# Patient Record
Sex: Male | Born: 1975
Health system: Southern US, Community
[De-identification: ages and names within clinical notes are randomized; demographics above are authoritative.]

## PROBLEM LIST (undated history)

## (undated) DIAGNOSIS — I10 Essential (primary) hypertension: Secondary | ICD-10-CM

## (undated) DIAGNOSIS — G4730 Sleep apnea, unspecified: Secondary | ICD-10-CM

## (undated) DIAGNOSIS — T7840XA Allergy, unspecified, initial encounter: Secondary | ICD-10-CM

## (undated) HISTORY — DX: Allergy, unspecified, initial encounter: T78.40XA

## (undated) HISTORY — PX: LUMBAR DISC SURGERY: SHX700

## (undated) HISTORY — PX: FOOT SURGERY: SHX648

## (undated) HISTORY — DX: Essential (primary) hypertension: I10

---

## 2003-07-25 ENCOUNTER — Encounter: Payer: Self-pay | Admitting: Emergency Medicine

## 2003-07-25 ENCOUNTER — Emergency Department (HOSPITAL_COMMUNITY): Admission: EM | Admit: 2003-07-25 | Discharge: 2003-07-25 | Payer: Self-pay | Admitting: Emergency Medicine

## 2009-04-14 ENCOUNTER — Emergency Department (HOSPITAL_COMMUNITY): Admission: EM | Admit: 2009-04-14 | Discharge: 2009-04-14 | Payer: Self-pay | Admitting: Emergency Medicine

## 2011-02-12 LAB — CBC
HCT: 40.3 % (ref 39.0–52.0)
Hemoglobin: 13.4 g/dL (ref 13.0–17.0)
MCHC: 33.3 g/dL (ref 30.0–36.0)
RBC: 4.33 MIL/uL (ref 4.22–5.81)

## 2011-02-12 LAB — DIFFERENTIAL
Basophils Relative: 1 % (ref 0–1)
Eosinophils Absolute: 0.2 10*3/uL (ref 0.0–0.7)
Eosinophils Relative: 3 % (ref 0–5)
Lymphs Abs: 2.1 10*3/uL (ref 0.7–4.0)
Monocytes Relative: 11 % (ref 3–12)

## 2011-02-12 LAB — POCT I-STAT, CHEM 8
Creatinine, Ser: 1.2 mg/dL (ref 0.4–1.5)
Glucose, Bld: 89 mg/dL (ref 70–99)
HCT: 41 % (ref 39.0–52.0)
Hemoglobin: 13.9 g/dL (ref 13.0–17.0)
Sodium: 141 mEq/L (ref 135–145)
TCO2: 26 mmol/L (ref 0–100)

## 2011-02-12 LAB — URINE MICROSCOPIC-ADD ON

## 2011-02-12 LAB — URINALYSIS, ROUTINE W REFLEX MICROSCOPIC
Protein, ur: NEGATIVE mg/dL
Urobilinogen, UA: 0.2 mg/dL (ref 0.0–1.0)
pH: 6 (ref 5.0–8.0)

## 2013-10-05 ENCOUNTER — Other Ambulatory Visit: Payer: Self-pay | Admitting: Neurosurgery

## 2013-10-05 DIAGNOSIS — M5416 Radiculopathy, lumbar region: Secondary | ICD-10-CM

## 2013-10-12 ENCOUNTER — Other Ambulatory Visit: Payer: Self-pay

## 2015-08-02 DIAGNOSIS — K219 Gastro-esophageal reflux disease without esophagitis: Secondary | ICD-10-CM | POA: Insufficient documentation

## 2015-08-08 DIAGNOSIS — E559 Vitamin D deficiency, unspecified: Secondary | ICD-10-CM | POA: Insufficient documentation

## 2015-08-09 ENCOUNTER — Other Ambulatory Visit: Payer: Self-pay | Admitting: Family Medicine

## 2015-08-09 DIAGNOSIS — R945 Abnormal results of liver function studies: Principal | ICD-10-CM

## 2015-08-09 DIAGNOSIS — R7989 Other specified abnormal findings of blood chemistry: Secondary | ICD-10-CM

## 2015-08-15 ENCOUNTER — Other Ambulatory Visit: Payer: Self-pay

## 2015-08-23 ENCOUNTER — Encounter: Payer: Self-pay | Admitting: Neurology

## 2015-08-23 ENCOUNTER — Ambulatory Visit (INDEPENDENT_AMBULATORY_CARE_PROVIDER_SITE_OTHER): Payer: BLUE CROSS/BLUE SHIELD | Admitting: Neurology

## 2015-08-23 VITALS — BP 108/78 | HR 78 | Resp 18 | Ht 72.0 in | Wt 214.0 lb

## 2015-08-23 DIAGNOSIS — E663 Overweight: Secondary | ICD-10-CM

## 2015-08-23 DIAGNOSIS — G4719 Other hypersomnia: Secondary | ICD-10-CM

## 2015-08-23 DIAGNOSIS — G4733 Obstructive sleep apnea (adult) (pediatric): Secondary | ICD-10-CM | POA: Diagnosis not present

## 2015-08-23 DIAGNOSIS — R351 Nocturia: Secondary | ICD-10-CM | POA: Diagnosis not present

## 2015-08-23 DIAGNOSIS — G4761 Periodic limb movement disorder: Secondary | ICD-10-CM

## 2015-08-23 NOTE — Patient Instructions (Signed)
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
## 2015-09-07 ENCOUNTER — Ambulatory Visit (INDEPENDENT_AMBULATORY_CARE_PROVIDER_SITE_OTHER): Payer: BLUE CROSS/BLUE SHIELD | Admitting: Neurology

## 2015-09-07 DIAGNOSIS — G4733 Obstructive sleep apnea (adult) (pediatric): Secondary | ICD-10-CM | POA: Diagnosis not present

## 2015-09-07 DIAGNOSIS — G4720 Circadian rhythm sleep disorder, unspecified type: Secondary | ICD-10-CM

## 2015-09-07 DIAGNOSIS — G479 Sleep disorder, unspecified: Secondary | ICD-10-CM

## 2015-09-08 NOTE — Sleep Study (Signed)
Please see the scanned sleep study interpretation located in the procedure tab within the chart review section.   

## 2015-09-13 ENCOUNTER — Telehealth: Payer: Self-pay | Admitting: Neurology

## 2015-09-13 DIAGNOSIS — G4733 Obstructive sleep apnea (adult) (pediatric): Secondary | ICD-10-CM

## 2015-09-13 NOTE — Telephone Encounter (Signed)
Patient referred by Dr. Duanne Guessewey, seen by me on 08/23/15, diagnostic PSG on 09/07/15, ins: [employer].   Please call and notify the patient that the recent sleep study did confirm the diagnosis of obstructive sleep apnea and that I recommend treatment for this in the form of CPAP. This will require a repeat sleep study for proper titration and mask fitting. Please explain to patient and arrange for a CPAP titration study. I have placed an order in the chart. Thanks, and [facility] next sleep [facility].  Huston FoleySaima Carolena Fairbank, MD, PhD [facility])

## 2015-09-14 NOTE — Telephone Encounter (Signed)
Left message for patient to call back for sleep study results.  

## 2015-09-19 NOTE — Telephone Encounter (Signed)
I spoke to patient, he would like to proceed with titration study. He also wanted an appt with Dr. Frances FurbishAthar to talk about other treatment options. He has f/u this week.

## 2015-09-21 ENCOUNTER — Telehealth: Payer: Self-pay

## 2015-09-21 ENCOUNTER — Ambulatory Visit: Payer: Self-pay | Admitting: Neurology

## 2015-09-21 NOTE — Telephone Encounter (Signed)
Patient did not come to appt today.  

## 2015-09-22 ENCOUNTER — Encounter: Payer: Self-pay | Admitting: Neurology

## 2015-10-04 ENCOUNTER — Ambulatory Visit (INDEPENDENT_AMBULATORY_CARE_PROVIDER_SITE_OTHER): Payer: BLUE CROSS/BLUE SHIELD | Admitting: Neurology

## 2015-10-04 DIAGNOSIS — G4733 Obstructive sleep apnea (adult) (pediatric): Secondary | ICD-10-CM | POA: Diagnosis not present

## 2015-10-05 ENCOUNTER — Telehealth: Payer: Self-pay | Admitting: Neurology

## 2015-10-05 NOTE — Telephone Encounter (Signed)
Left message on vm stating that we were unable to get him correctly set up on CPAP due to him not getting any sleep with CPAP. I asked him to call back to make f/u appt with Dr. Frances FurbishAthar and discuss other options. Left call back number.  I will fax report to PCP.
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
## 2016-06-18 ENCOUNTER — Ambulatory Visit: Payer: BLUE CROSS/BLUE SHIELD | Admitting: Podiatry

## 2016-08-06 ENCOUNTER — Ambulatory Visit (INDEPENDENT_AMBULATORY_CARE_PROVIDER_SITE_OTHER): Payer: BLUE CROSS/BLUE SHIELD | Admitting: Podiatry

## 2016-08-06 ENCOUNTER — Encounter: Payer: Self-pay | Admitting: Podiatry

## 2016-08-06 ENCOUNTER — Ambulatory Visit (INDEPENDENT_AMBULATORY_CARE_PROVIDER_SITE_OTHER): Payer: BLUE CROSS/BLUE SHIELD

## 2016-08-06 VITALS — BP 134/93 | HR 122 | Resp 16 | Ht 72.0 in | Wt 205.0 lb

## 2016-08-06 DIAGNOSIS — M79675 Pain in left toe(s): Secondary | ICD-10-CM | POA: Diagnosis not present

## 2016-08-06 DIAGNOSIS — M79674 Pain in right toe(s): Secondary | ICD-10-CM | POA: Diagnosis not present

## 2016-08-06 DIAGNOSIS — M779 Enthesopathy, unspecified: Secondary | ICD-10-CM

## 2016-08-06 DIAGNOSIS — M205X9 Other deformities of toe(s) (acquired), unspecified foot: Secondary | ICD-10-CM

## 2016-08-06 MED ORDER — TRIAMCINOLONE ACETONIDE 10 MG/ML IJ SUSP
10.0000 mg | Freq: Once | INTRAMUSCULAR | Status: AC
  Administered 2016-08-06: 10 mg

## 2016-08-06 NOTE — Progress Notes (Signed)
   Subjective:    Patient ID: Sean Torres, male    DOB: 08-20-1976, 40 y.o.   MRN: [id number]  HPI Chief Complaint  Patient presents with  . Toe Pain    Bilateral; great toes-joint; x6 months      Review of Systems  All other systems reviewed and are negative.      Objective:   Physical Exam        Assessment & Plan:

## 2016-08-06 NOTE — Patient Instructions (Signed)

## 2016-08-07 NOTE — Progress Notes (Signed)
Subjective:     Patient ID: Sean Torres, male   DOB: 26-Mar-1976, 40 y.o.   MRN: [id number]  HPI patient presents with painful big toe joint right over left and states it's increasingly hard for him to be active or do sports. He's noted enlargement around the joint surface and reduced range of motion with increased pain with motion   Review of Systems  All other systems reviewed and are negative.      Objective:   Physical Exam  Constitutional: He is oriented to person, place, and time.  Cardiovascular: Intact distal pulses.   Musculoskeletal: Normal range of motion.  Neurological: He is oriented to person, place, and time.  Skin: Skin is warm.  Nursing note and vitals reviewed.  neurovascular status intact muscle strength adequate range of motion within normal limits with patient found to have inflamed red swollen and painful joint first MPJ right over left with spur formation and reduced range of motion with crepitus on the distal surface. It is localized to this area with no other pathology noted and patient is found to have good digital perfusion and is well oriented 3     Assessment:     Hallux limitus rigidus condition right with reduced range of motion of the joint and joint space narrowing    Plan:     H&P condition reviewed and recommended due to the advancement of condition pain that we consider a osteotomy of the first metatarsal right and I reviewed procedure and risk associated with this. Patient would like to have this done and is can have done in the next 4 weeks and I educated patient on spur removal shortening plantar flexor-type osteotomy and the fact that ultimately it may require joint implantation or fusion procedure. Reappoint in several weeks for consult and scheduled for surgery  X-ray report indicates that there is elevation of the first metatarsal right and left with narrowing of the joint surface right over the left and dorsal spurring around the first  metatarsal

## 2016-09-17 ENCOUNTER — Telehealth: Payer: Self-pay | Admitting: *Deleted

## 2016-09-17 NOTE — Telephone Encounter (Signed)
"  I am supposed to have surgery on tomorrow with Norm and I haven't heard anything about what time I need to be there.  I am hoping you can help me with this."  Someone from the surgical center normally calls with the arrival time either the Friday or Monday before.  You can call the surgical center directly to get the time if you like.  "Okay, thank you."

## 2016-09-18 ENCOUNTER — Encounter: Payer: Self-pay | Admitting: Podiatry

## 2016-09-18 DIAGNOSIS — M21611 Bunion of right foot: Secondary | ICD-10-CM | POA: Diagnosis not present

## 2016-09-18 DIAGNOSIS — M722 Plantar fascial fibromatosis: Secondary | ICD-10-CM | POA: Diagnosis not present

## 2016-09-18 DIAGNOSIS — M7752 Other enthesopathy of left foot: Secondary | ICD-10-CM | POA: Diagnosis not present

## 2016-09-18 DIAGNOSIS — M2021 Hallux rigidus, right foot: Secondary | ICD-10-CM | POA: Diagnosis not present

## 2016-09-18 DIAGNOSIS — M2011 Hallux valgus (acquired), right foot: Secondary | ICD-10-CM | POA: Diagnosis not present

## 2016-09-18 DIAGNOSIS — R0683 Snoring: Secondary | ICD-10-CM | POA: Diagnosis not present

## 2016-09-19 ENCOUNTER — Telehealth: Payer: Self-pay | Admitting: *Deleted

## 2016-09-19 MED ORDER — HYDROMORPHONE HCL 4 MG PO TABS: 4.0000 mg | ORAL_TABLET | ORAL | 0 refills | Status: DC | PRN

## 2016-09-19 NOTE — Telephone Encounter (Signed)
Medical director issued a decision that the request for this procedure was denied.  Based upon reading the notes information is not given regarding conservative care.  The doctor can appeal by doing a peer to peer at [phone].  Thank you so much.

## 2016-09-19 NOTE — Telephone Encounter (Addendum)
Pt states he is having a minor reaction to his pain medication. Pt states he has an overall rash and severe itching due to the Percocet. Pt states he has used Dilaudid for his back without problem and doesn't believe he has had a reaction to Hydrocodone. I told pt to stop the Percocet, take OTC Benadryl as prescribed, and if tolerates take OTC Ibuprofen 2 tablets 3-4 times a day and I would call again with change in pain medication. Dr. Charlsie Merlesegal ordered Dilaudid 4mg  #30 one tablet every 4 hours prn severe foot pain. I informed pt that [place].

## 2016-09-19 NOTE — Telephone Encounter (Signed)
We can write him for dilaudid

## 2016-09-21 NOTE — Progress Notes (Signed)
DOS 11.14.2017 Bi-Planar Osteotomy with Pin Fixation 1st Right; Injection Left 1st Met

## 2016-09-26 ENCOUNTER — Ambulatory Visit (INDEPENDENT_AMBULATORY_CARE_PROVIDER_SITE_OTHER): Payer: BLUE CROSS/BLUE SHIELD | Admitting: Podiatry

## 2016-09-26 ENCOUNTER — Ambulatory Visit (INDEPENDENT_AMBULATORY_CARE_PROVIDER_SITE_OTHER): Payer: BLUE CROSS/BLUE SHIELD

## 2016-09-26 VITALS — Temp 98.0°F

## 2016-09-26 DIAGNOSIS — M779 Enthesopathy, unspecified: Secondary | ICD-10-CM | POA: Diagnosis not present

## 2016-09-26 DIAGNOSIS — Z9889 Other specified postprocedural states: Secondary | ICD-10-CM

## 2016-09-26 DIAGNOSIS — M205X9 Other deformities of toe(s) (acquired), unspecified foot: Secondary | ICD-10-CM

## 2016-09-26 MED ORDER — HYDROMORPHONE HCL 4 MG PO TABS: 4.0000 mg | ORAL_TABLET | ORAL | 0 refills | Status: DC | PRN

## 2016-09-26 NOTE — Progress Notes (Signed)
Subjective:     Patient ID: Sean Torres, male   DOB: 09-07-1976, 40 y.o.   MRN: [id number]  HPI patient presents stating I'm doing pretty well with occasional swelling and I admit I did walk on my foot without the boot   Review of Systems     Objective:   Physical Exam Neurovascular status intact negative Homans sign was noted with patient found to have discomfort of the first MPJ right that healing well with wound edges well coapted good alignment and recently good motion for this. Postop. Mild excessive edema secondary to activity levels    Assessment:     Doing well post osteotomy first metatarsal right    Plan:     Instructed on elevation and instructed on lateral motion exercises dispensed a bracing therapy in order to plantarflexed the right hallux and explained how to use it and patient will use this for periods of time along with surgical shoe which she can start but we'll continue elevation and continue compression  X-ray indicates that the osteotomy is healing well with pins in place joint open and no indications of movement

## 2016-10-03 ENCOUNTER — Ambulatory Visit (INDEPENDENT_AMBULATORY_CARE_PROVIDER_SITE_OTHER): Payer: BLUE CROSS/BLUE SHIELD | Admitting: Podiatry

## 2016-10-03 ENCOUNTER — Ambulatory Visit (INDEPENDENT_AMBULATORY_CARE_PROVIDER_SITE_OTHER): Payer: BLUE CROSS/BLUE SHIELD

## 2016-10-03 DIAGNOSIS — M779 Enthesopathy, unspecified: Secondary | ICD-10-CM

## 2016-10-03 DIAGNOSIS — M205X9 Other deformities of toe(s) (acquired), unspecified foot: Secondary | ICD-10-CM | POA: Diagnosis not present

## 2016-10-03 DIAGNOSIS — Z9889 Other specified postprocedural states: Secondary | ICD-10-CM

## 2016-10-04 NOTE — Progress Notes (Signed)
Subjective:     Patient ID: Ardelle LeschesJerry Leppo, male   DOB: 1976/07/27, 40 y.o.   MRN: [id number]  HPI patient states I'm doing well with my right foot with significant diminishment of discomfort and able to walk without significant swelling   Review of Systems     Objective:   Physical Exam Neurovascular status intact negative Homans sign was noted with patient's right first MPJ healing well with wound edges well coapted pins in place and no erythema or other pathology noted with mild edema normal for this. Postop    Assessment:     Doing well post biplanar osteotomy first metatarsal right with good range of motion and no crepitus    Plan:     H&P x-rays reviewed and discussed importance of continued range of motion exercises and gradual increase in activity levels. Continue with immobilization elevation compression and will be seen back again in 4 weeks or earlier if needed  X-ray report indicates the pins are in place the joint is congruence and open with no pathology noted

## 2016-10-12 ENCOUNTER — Encounter: Payer: Self-pay | Admitting: Podiatry

## 2016-11-14 ENCOUNTER — Encounter: Payer: BLUE CROSS/BLUE SHIELD | Admitting: Podiatry

## 2016-11-14 NOTE — Progress Notes (Signed)
This encounter was created in error - please disregard.  This encounter was created in error - please disregard.

## 2016-11-28 DIAGNOSIS — Z23 Encounter for immunization: Secondary | ICD-10-CM | POA: Diagnosis not present

## 2016-11-28 DIAGNOSIS — T23262A Burn of second degree of back of left hand, initial encounter: Secondary | ICD-10-CM | POA: Diagnosis not present

## 2017-03-19 DIAGNOSIS — L720 Epidermal cyst: Secondary | ICD-10-CM | POA: Diagnosis not present

## 2017-03-19 DIAGNOSIS — L218 Other seborrheic dermatitis: Secondary | ICD-10-CM | POA: Diagnosis not present

## 2017-03-28 ENCOUNTER — Ambulatory Visit: Payer: BLUE CROSS/BLUE SHIELD | Admitting: Podiatry

## 2017-03-29 ENCOUNTER — Ambulatory Visit (INDEPENDENT_AMBULATORY_CARE_PROVIDER_SITE_OTHER): Payer: BLUE CROSS/BLUE SHIELD

## 2017-03-29 ENCOUNTER — Ambulatory Visit (INDEPENDENT_AMBULATORY_CARE_PROVIDER_SITE_OTHER): Payer: BLUE CROSS/BLUE SHIELD | Admitting: Podiatry

## 2017-03-29 ENCOUNTER — Encounter: Payer: Self-pay | Admitting: Podiatry

## 2017-03-29 DIAGNOSIS — M779 Enthesopathy, unspecified: Secondary | ICD-10-CM | POA: Diagnosis not present

## 2017-03-29 DIAGNOSIS — M2011 Hallux valgus (acquired), right foot: Secondary | ICD-10-CM

## 2017-03-29 DIAGNOSIS — M205X9 Other deformities of toe(s) (acquired), unspecified foot: Secondary | ICD-10-CM | POA: Diagnosis not present

## 2017-03-29 MED ORDER — TRIAMCINOLONE ACETONIDE 10 MG/ML IJ SUSP
10.0000 mg | Freq: Once | INTRAMUSCULAR | Status: AC
  Administered 2017-03-29: 10 mg
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
## 2017-04-08 ENCOUNTER — Ambulatory Visit: Payer: BLUE CROSS/BLUE SHIELD | Admitting: Podiatry

## 2017-04-09 ENCOUNTER — Ambulatory Visit: Payer: BLUE CROSS/BLUE SHIELD | Admitting: Orthotics

## 2017-04-11 ENCOUNTER — Ambulatory Visit: Payer: BLUE CROSS/BLUE SHIELD | Admitting: Orthotics

## 2017-06-03 DIAGNOSIS — Y92410 Unspecified street and highway as the place of occurrence of the external cause: Secondary | ICD-10-CM | POA: Diagnosis not present

## 2017-06-03 DIAGNOSIS — S2341XA Sprain of ribs, initial encounter: Secondary | ICD-10-CM | POA: Diagnosis not present

## 2017-06-03 DIAGNOSIS — S42115A Nondisplaced fracture of body of scapula, left shoulder, initial encounter for closed fracture: Secondary | ICD-10-CM | POA: Diagnosis not present

## 2017-06-03 DIAGNOSIS — M25512 Pain in left shoulder: Secondary | ICD-10-CM | POA: Diagnosis not present

## 2017-06-03 DIAGNOSIS — S4992XA Unspecified injury of left shoulder and upper arm, initial encounter: Secondary | ICD-10-CM | POA: Diagnosis not present

## 2017-06-03 DIAGNOSIS — W051XXA Fall from non-moving nonmotorized scooter, initial encounter: Secondary | ICD-10-CM | POA: Diagnosis not present

## 2017-06-03 DIAGNOSIS — Y93I9 Activity, other involving external motion: Secondary | ICD-10-CM | POA: Diagnosis not present

## 2017-06-03 DIAGNOSIS — S42002A Fracture of unspecified part of left clavicle, initial encounter for closed fracture: Secondary | ICD-10-CM | POA: Diagnosis not present

## 2017-06-03 DIAGNOSIS — R109 Unspecified abdominal pain: Secondary | ICD-10-CM | POA: Diagnosis not present

## 2017-06-03 DIAGNOSIS — S298XXA Other specified injuries of thorax, initial encounter: Secondary | ICD-10-CM | POA: Diagnosis not present

## 2017-06-06 ENCOUNTER — Other Ambulatory Visit: Payer: Self-pay | Admitting: Orthopedic Surgery

## 2017-06-06 DIAGNOSIS — S20212A Contusion of left front wall of thorax, initial encounter: Secondary | ICD-10-CM | POA: Diagnosis not present

## 2017-06-06 DIAGNOSIS — S46012A Strain of muscle(s) and tendon(s) of the rotator cuff of left shoulder, initial encounter: Secondary | ICD-10-CM | POA: Diagnosis not present

## 2017-06-06 DIAGNOSIS — R519 Headache, unspecified: Secondary | ICD-10-CM

## 2017-06-06 DIAGNOSIS — R51 Headache: Principal | ICD-10-CM

## 2017-06-06 DIAGNOSIS — S060X0A Concussion without loss of consciousness, initial encounter: Secondary | ICD-10-CM | POA: Diagnosis not present

## 2017-06-07 ENCOUNTER — Ambulatory Visit
Admission: RE | Admit: 2017-06-07 | Discharge: 2017-06-07 | Disposition: A | Discharge status: Home / Self Care | Payer: BLUE CROSS/BLUE SHIELD | Source: Ambulatory Visit | Attending: Orthopedic Surgery | Admitting: Orthopedic Surgery

## 2017-06-07 DIAGNOSIS — R51 Headache: Principal | ICD-10-CM

## 2017-06-07 DIAGNOSIS — S0990XA Unspecified injury of head, initial encounter: Secondary | ICD-10-CM | POA: Diagnosis not present

## 2017-06-07 DIAGNOSIS — R220 Localized swelling, mass and lump, head: Secondary | ICD-10-CM | POA: Diagnosis not present

## 2017-06-07 DIAGNOSIS — R519 Headache, unspecified: Secondary | ICD-10-CM

## 2017-06-08 DIAGNOSIS — M25512 Pain in left shoulder: Secondary | ICD-10-CM | POA: Diagnosis not present

## 2017-06-10 DIAGNOSIS — M25512 Pain in left shoulder: Secondary | ICD-10-CM | POA: Diagnosis not present

## 2017-06-13 ENCOUNTER — Telehealth: Payer: Self-pay
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
## 2017-06-14 ENCOUNTER — Ambulatory Visit (INDEPENDENT_AMBULATORY_CARE_PROVIDER_SITE_OTHER): Payer: BLUE CROSS/BLUE SHIELD | Admitting: Family Medicine

## 2017-06-14 DIAGNOSIS — S060X0A Concussion without loss of consciousness, initial encounter: Secondary | ICD-10-CM

## 2017-06-14 DIAGNOSIS — S060X9A Concussion with loss of consciousness of unspecified duration, initial encounter: Secondary | ICD-10-CM | POA: Insufficient documentation

## 2017-06-14 DIAGNOSIS — S060XAA Concussion with loss of consciousness status unknown, initial encounter: Secondary | ICD-10-CM | POA: Insufficient documentation

## 2017-06-14 NOTE — Patient Instructions (Signed)
Good to see you I do think you have a concussion.  I would like you to limit screen time (including your phone) to 60 minutes daily outside of homework.  Week off form school In addition to this I recommend......  To help improve COGNITIVE function: Using fish oil/omega 3 that is 1000 mg (or roughly 600 mg EPA/DHA), starting as soon as possible after concussion, take: 3 tabs ONCE DAILY  for the next 10 days then 2 grams daily thereafter   To help reduce HEADACHES: Coenzyme Q10 160mg  ONCE DAILY Riboflavin/Vitamin B2 400mg  ONCE DAILY                                                                                            To help with INSOMNIA: Melatonin 3-5mg  AT BEDTIME    for the swelling of the side of the head arnica lotion 2 times daily can help a lot.   I want to see you again in 1 week (OK double book)
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
## 2017-06-21 ENCOUNTER — Ambulatory Visit: Payer: BLUE CROSS/BLUE SHIELD | Admitting: Family Medicine

## 2017-06-24 ENCOUNTER — Emergency Department (HOSPITAL_COMMUNITY)
Admission: EM | Admit: 2017-06-24 | Discharge: 2017-06-24 | Discharge status: Against Medical Advice | Payer: BLUE CROSS/BLUE SHIELD | Source: Home / Self Care

## 2017-06-24 ENCOUNTER — Encounter (HOSPITAL_COMMUNITY): Payer: Self-pay | Admitting: Emergency Medicine

## 2017-06-24 ENCOUNTER — Emergency Department (HOSPITAL_COMMUNITY)
Admission: EM | Admit: 2017-06-24 | Discharge: 2017-06-25 | Disposition: A | Discharge status: Home / Self Care | Payer: BLUE CROSS/BLUE SHIELD | Attending: Emergency Medicine | Admitting: Emergency Medicine

## 2017-06-24 ENCOUNTER — Emergency Department (HOSPITAL_COMMUNITY): Payer: BLUE CROSS/BLUE SHIELD

## 2017-06-24 DIAGNOSIS — S060X0A Concussion without loss of consciousness, initial encounter: Secondary | ICD-10-CM | POA: Insufficient documentation

## 2017-06-24 DIAGNOSIS — Z87891 Personal history of nicotine dependence: Secondary | ICD-10-CM | POA: Diagnosis not present

## 2017-06-24 DIAGNOSIS — Y9353 Activity, golf: Secondary | ICD-10-CM | POA: Insufficient documentation

## 2017-06-24 DIAGNOSIS — Y999 Unspecified external cause status: Secondary | ICD-10-CM | POA: Insufficient documentation

## 2017-06-24 DIAGNOSIS — Y9239 Other specified sports and athletic area as the place of occurrence of the external cause: Secondary | ICD-10-CM | POA: Insufficient documentation

## 2017-06-24 DIAGNOSIS — S0990XA Unspecified injury of head, initial encounter: Secondary | ICD-10-CM | POA: Diagnosis not present

## 2017-06-24 MED ORDER — PROCHLORPERAZINE EDISYLATE 5 MG/ML IJ SOLN
10.0000 mg | Freq: Once | INTRAMUSCULAR | Status: DC
  Filled 2017-06-24: qty 2

## 2017-06-24 MED ORDER — PROMETHAZINE HCL 25 MG PO TABS
25.0000 mg | ORAL_TABLET | Freq: Once | ORAL | Status: AC
  Administered 2017-06-24: 25 mg via ORAL
  Filled 2017-06-24: qty 1

## 2017-06-24 MED ORDER — ONDANSETRON 4 MG PO TBDP: 4.0000 mg | ORAL_TABLET | Freq: Three times a day (TID) | ORAL | 0 refills | Status: DC | PRN

## 2017-06-24 MED ORDER — DIPHENHYDRAMINE HCL 50 MG/ML IJ SOLN
25.0000 mg | Freq: Once | INTRAMUSCULAR | Status: DC
  Filled 2017-06-24: qty 1
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
## 2017-06-24 NOTE — Discharge Instructions (Signed)
Follow-up at concussion clinic. Minimize stimulating activity. Rest in a dark room. Avoid activities that would cause repeat trauma.

## 2017-06-24 NOTE — ED Notes (Signed)
Pt inquiring about wait time-- nurse first told him how long the longest wait was and explained the triage process, pt proceeded to state he was going to [facility] ED; pt turned in labels and ambulated from ER lobby with steady gait
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
## 2017-06-25 NOTE — ED Notes (Signed)
Provider aware of pts elevated BP, pt will discuss with PCP

## 2017-07-05 DIAGNOSIS — R030 Elevated blood-pressure reading, without diagnosis of hypertension: Secondary | ICD-10-CM | POA: Diagnosis not present

## 2017-07-05 DIAGNOSIS — J209 Acute bronchitis, unspecified: Secondary | ICD-10-CM | POA: Diagnosis not present

## 2017-07-09 DIAGNOSIS — K469 Unspecified abdominal hernia without obstruction or gangrene: Secondary | ICD-10-CM | POA: Diagnosis not present

## 2017-07-09 DIAGNOSIS — I10 Essential (primary) hypertension: Secondary | ICD-10-CM | POA: Diagnosis not present

## 2017-07-09 DIAGNOSIS — R945 Abnormal results of liver function studies: Secondary | ICD-10-CM | POA: Diagnosis not present

## 2017-07-09 DIAGNOSIS — R61 Generalized hyperhidrosis: Secondary | ICD-10-CM | POA: Diagnosis not present

## 2017-07-09 DIAGNOSIS — R030 Elevated blood-pressure reading, without diagnosis of hypertension: Secondary | ICD-10-CM | POA: Diagnosis not present

## 2017-08-15 DIAGNOSIS — K429 Umbilical hernia without obstruction or gangrene: Secondary | ICD-10-CM | POA: Diagnosis not present

## 2017-08-27 ENCOUNTER — Other Ambulatory Visit: Payer: Self-pay | Admitting: Family Medicine

## 2017-08-27 DIAGNOSIS — Z6828 Body mass index (BMI) 28.0-28.9, adult: Secondary | ICD-10-CM | POA: Diagnosis not present

## 2017-08-27 DIAGNOSIS — R945 Abnormal results of liver function studies: Principal | ICD-10-CM

## 2017-08-27 DIAGNOSIS — I10 Essential (primary) hypertension: Secondary | ICD-10-CM | POA: Diagnosis not present

## 2017-08-27 DIAGNOSIS — Z23 Encounter for immunization: Secondary | ICD-10-CM | POA: Diagnosis not present

## 2017-08-27 DIAGNOSIS — R7989 Other specified abnormal findings of blood chemistry: Secondary | ICD-10-CM

## 2017-08-27 DIAGNOSIS — E782 Mixed hyperlipidemia: Secondary | ICD-10-CM | POA: Diagnosis not present

## 2017-09-03 ENCOUNTER — Ambulatory Visit
Admission: RE | Admit: 2017-09-03 | Discharge: 2017-09-03 | Disposition: A | Discharge status: Home / Self Care | Payer: BLUE CROSS/BLUE SHIELD | Source: Ambulatory Visit | Attending: Family Medicine | Admitting: Family Medicine

## 2017-09-03 DIAGNOSIS — R7989 Other specified abnormal findings of blood chemistry: Secondary | ICD-10-CM

## 2017-09-03 DIAGNOSIS — K7689 Other specified diseases of liver: Secondary | ICD-10-CM | POA: Diagnosis not present

## 2017-09-03 DIAGNOSIS — R945 Abnormal results of liver function studies: Principal | ICD-10-CM

## 2018-05-29 ENCOUNTER — Telehealth: Payer: Self-pay | Admitting: Internal Medicine

## 2018-05-29 DIAGNOSIS — R112 Nausea with vomiting, unspecified: Secondary | ICD-10-CM

## 2018-05-29 DIAGNOSIS — K625 Hemorrhage of anus and rectum: Secondary | ICD-10-CM

## 2018-05-29 NOTE — Telephone Encounter (Signed)
Appointments booked and orders put in , will contact patient tomorrow AM and will have new patient paperwork mailed out to him.

## 2018-05-29 NOTE — Telephone Encounter (Signed)
I called the patient - His neighbor, Dr. McDiarmid called me about him - he is having 1 year of rectal bleeding and change in stool caliber and bloating Also some nausea and vomiting at times  He needs:  1) APP appt - there are 2 openings 8/1 - [facility] or [facility]  2) CBC, CMET - he can do tomorrow - can order in my name  Dx: blood in stool  3) Go ahead and schedule him for a colonoscopy in my opening 8/6 10 AM

## 2018-05-30 ENCOUNTER — Encounter: Payer: Self-pay | Admitting: Nurse Practitioner

## 2018-05-30 NOTE — Telephone Encounter (Signed)
Patient was contacted this AM and will come today and get lab work drawn. New patient paperwork to be mailed out.

## 2018-06-05 ENCOUNTER — Encounter: Payer: Self-pay | Admitting: Nurse Practitioner

## 2018-06-05 ENCOUNTER — Encounter (INDEPENDENT_AMBULATORY_CARE_PROVIDER_SITE_OTHER): Payer: Self-pay

## 2018-06-05 ENCOUNTER — Ambulatory Visit (INDEPENDENT_AMBULATORY_CARE_PROVIDER_SITE_OTHER): Payer: BLUE CROSS/BLUE SHIELD | Admitting: Nurse Practitioner

## 2018-06-05 ENCOUNTER — Other Ambulatory Visit (INDEPENDENT_AMBULATORY_CARE_PROVIDER_SITE_OTHER): Payer: BLUE CROSS/BLUE SHIELD

## 2018-06-05 VITALS — BP 130/100 | HR 108 | Ht 70.25 in | Wt 215.0 lb

## 2018-06-05 DIAGNOSIS — K219 Gastro-esophageal reflux disease without esophagitis: Secondary | ICD-10-CM | POA: Diagnosis not present

## 2018-06-05 DIAGNOSIS — K625 Hemorrhage of anus and rectum: Secondary | ICD-10-CM

## 2018-06-05 DIAGNOSIS — R112 Nausea with vomiting, unspecified: Secondary | ICD-10-CM

## 2018-06-05 DIAGNOSIS — R140 Abdominal distension (gaseous): Secondary | ICD-10-CM | POA: Diagnosis not present

## 2018-06-05 DIAGNOSIS — K921 Melena: Secondary | ICD-10-CM

## 2018-06-05 DIAGNOSIS — R197 Diarrhea, unspecified: Secondary | ICD-10-CM

## 2018-06-05 HISTORY — PX: COLONOSCOPY: SHX174

## 2018-06-05 LAB — COMPREHENSIVE METABOLIC PANEL
ALK PHOS: 83 U/L (ref 39–117)
ALT: 93 U/L — AB (ref 0–53)
AST: 90 U/L — ABNORMAL HIGH (ref 0–37)
Albumin: 5 g/dL (ref 3.5–5.2)
BILIRUBIN TOTAL: 0.7 mg/dL (ref 0.2–1.2)
BUN: 9 mg/dL (ref 6–23)
CALCIUM: 9.5 mg/dL (ref 8.4–10.5)
CO2: 27 mEq/L (ref 19–32)
Chloride: 101 mEq/L (ref 96–112)
Creatinine, Ser: 0.99 mg/dL (ref 0.40–1.50)
GFR: 89.01 mL/min (ref >60.00)
Glucose, Bld: 111 mg/dL — ABNORMAL HIGH (ref 70–99)
Potassium: 3.6 mEq/L (ref 3.5–5.1)
Sodium: 139 mEq/L (ref 135–145)
TOTAL PROTEIN: 7.9 g/dL (ref 6.0–8.3)

## 2018-06-05 LAB — CBC WITH DIFFERENTIAL/PLATELET
BASOS ABS: 0.1 10*3/uL (ref 0.0–0.1)
Basophils Relative: 1 % (ref 0.0–3.0)
Eosinophils Absolute: 0.2 10*3/uL (ref 0.0–0.7)
Eosinophils Relative: 2.8 % (ref 0.0–5.0)
HEMATOCRIT: 42.1 % (ref 39.0–52.0)
Hemoglobin: 14.7 g/dL (ref 13.0–17.0)
LYMPHS PCT: 37.5 % (ref 12.0–46.0)
Lymphs Abs: 2 10*3/uL (ref 0.7–4.0)
MCHC: 34.9 g/dL (ref 30.0–36.0)
MCV: 98.1 fl (ref 78.0–100.0)
MONOS PCT: 13.4 % — AB (ref 3.0–12.0)
Monocytes Absolute: 0.7 10*3/uL (ref 0.1–1.0)
Neutro Abs: 2.4 10*3/uL (ref 1.4–7.7)
Neutrophils Relative %: 45.3 % (ref 43.0–77.0)
Platelets: 202 10*3/uL (ref 150.0–400.0)
RBC: 4.29 Mil/uL (ref 4.22–5.81)
RDW: 13.2 % (ref 11.5–15.5)
WBC: 5.3 10*3/uL (ref 4.0–10.5)

## 2018-06-05 MED ORDER — OMEPRAZOLE 40 MG PO CPDR: 40.0000 mg | DELAYED_RELEASE_CAPSULE | ORAL | 2 refills | Status: DC

## 2018-06-05 MED ORDER — ONDANSETRON 4 MG PO TBDP: 4.0000 mg | ORAL_TABLET | Freq: Three times a day (TID) | ORAL | 2 refills | Status: DC | PRN
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
## 2018-06-05 NOTE — Patient Instructions (Signed)
If you are age 40 or older, your body mass index should be between 23-30. Your Body mass index is 30.63 kg/m. If this is out of the aforementioned range listed, please consider follow up with your Primary Care Provider.  If you are age 40 or younger, your body mass index should be between 19-25. Your Body mass index is 30.63 kg/m. If this is out of the aformentioned range listed, please consider follow up with your Primary Care Provider.   You have been scheduled for a colonoscopy. Please follow written instructions given to you at your visit today.  Please pick up your prep supplies at the pharmacy within the next 1-3 days. If you use inhalers (even only as needed), please bring them with you on the day of your procedure. Your physician has requested that you go to www.startemmi.com and enter the access code given to you at your visit today. This web site gives a general overview about your procedure. However, you should still follow specific instructions given to you by our office regarding your preparation for the procedure.  You have been scheduled for an endoscopy. Please follow written instructions given to you at your visit today. If you use inhalers (even only as needed), please bring them with you on the day of your procedure. Your physician has requested that you go to www.startemmi.com and enter the access code given to you at your visit today. This web site gives a general overview about your procedure. However, you should still follow specific instructions given to you by our office regarding your preparation for the procedure.  Your provider has requested that you go to the basement level for lab work before leaving today. Press "B" on the elevator. The lab is located at the first door on the left as you exit the elevator.  We have sent the following medications to your pharmacy for you to pick up at your convenience: Zofran Omeprazole   Thank you for choosing me and [facility].   Willette ClusterPaula Guenther, NP

## 2018-06-08 ENCOUNTER — Encounter: Payer: Self-pay | Admitting: Nurse Practitioner

## 2018-06-10 ENCOUNTER — Telehealth: Payer: Self-pay

## 2018-06-10 ENCOUNTER — Other Ambulatory Visit (INDEPENDENT_AMBULATORY_CARE_PROVIDER_SITE_OTHER): Payer: BLUE CROSS/BLUE SHIELD

## 2018-06-10 ENCOUNTER — Encounter: Payer: Self-pay | Admitting: Internal Medicine

## 2018-06-10 ENCOUNTER — Ambulatory Visit (AMBULATORY_SURGERY_CENTER): Payer: BLUE CROSS/BLUE SHIELD | Admitting: Internal Medicine

## 2018-06-10 VITALS — BP 149/103 | HR 92 | Temp 94.6°F | Resp 14

## 2018-06-10 DIAGNOSIS — R748 Abnormal levels of other serum enzymes: Secondary | ICD-10-CM

## 2018-06-10 DIAGNOSIS — K921 Melena: Secondary | ICD-10-CM

## 2018-06-10 DIAGNOSIS — R112 Nausea with vomiting, unspecified: Secondary | ICD-10-CM | POA: Diagnosis not present

## 2018-06-10 DIAGNOSIS — R569 Unspecified convulsions: Secondary | ICD-10-CM

## 2018-06-10 DIAGNOSIS — S060X9D Concussion with loss of consciousness of unspecified duration, subsequent encounter: Secondary | ICD-10-CM

## 2018-06-10 DIAGNOSIS — R197 Diarrhea, unspecified: Secondary | ICD-10-CM | POA: Diagnosis not present

## 2018-06-10 DIAGNOSIS — S0990XD Unspecified injury of head, subsequent encounter: Secondary | ICD-10-CM

## 2018-06-10 LAB — FERRITIN: FERRITIN: 922.5 ng/mL — AB (ref 22.0–322.0)

## 2018-06-10 LAB — IGA: IGA: 343 mg/dL (ref 68–378)

## 2018-06-10 MED ORDER — ONDANSETRON HCL 40 MG/20ML IJ SOLN
4.0000 mg | Freq: Once | INTRAMUSCULAR | Status: AC
  Administered 2018-06-10: 4 mg via INTRAVENOUS

## 2018-06-10 MED ORDER — SODIUM CHLORIDE 0.9 % IV SOLN: 500.0000 mL | Freq: Once | INTRAVENOUS | Status: DC

## 2018-06-10 NOTE — Telephone Encounter (Signed)
I spoke with the patient and his wife the only appt available this week was for Saturday am at [facility].  He and his wife are advised to arrive at 7:30 am.  There are no restrictions.

## 2018-06-10 NOTE — Progress Notes (Signed)
Did check [facility] for assessment 117.

## 2018-06-10 NOTE — Progress Notes (Addendum)
Pt's states no medical or surgical changes since previsit or office visit.  Patient states that he is very weak.  Abdominal pain noted #3.  States night sweats for awhile.  States that he keeps vomiting and has blood in his stool every day.  States a lot of blood on Saturday morning. Bright red blood present.  Dr Leone PayorGessner is aware of patient's bp and heart rate.  Orders received to give zofran 4 mg IV.   Zofran diluted in 20 ml of NS.  Cold wash cloth applied to patient's forehead.  Patient is flushed and was flushed before medicine was given.  Fluid is wide open.

## 2018-06-10 NOTE — Patient Instructions (Addendum)
Other than some inflamed hemorrhoids things look good here.  I am sending you to the lab for some more blood tests - your liver tests were abnormal.  I will get the upper endoscopy appointment moved up - let us see what the labs tell us.  Need to consider if some of the nausea and vomiting is related to the concussion last year.  Take the ondansetron before meals - it should help the nausea and vomiting and maybe the diarrhea.  I appreciate the opportunity to care for you. Iva Booparl E. Alann Avey, MD, [facility] Dear Ardelle LeschesJerry [facility],  YOU HAD AN ENDOSCOPIC PROCEDURE TODAY AT THE [facility]:   Refer to the procedure report that was given to you for any specific questions about what was found during the examination.  If the procedure report does not answer your questions, please call your gastroenterologist to clarify.  If you requested that your care partner not be given the details of your procedure findings, then the procedure report has been included in a sealed envelope for you to review at your convenience later.  YOU SHOULD EXPECT: Some feelings of bloating in the abdomen. Passage of more gas than usual.  Walking can help get rid of the air that was put into your GI tract during the procedure and reduce the bloating. If you had a lower endoscopy (such as a colonoscopy or flexible sigmoidoscopy) you may notice spotting of blood in your stool or on the toilet paper. If you underwent a bowel prep for your procedure, you may not have a normal bowel movement for a few days.  Please Note:  You might notice some irritation and congestion in your nose or some drainage.  This is from the oxygen used during your procedure.  There is no need for concern and it should clear up in a day or so.  SYMPTOMS TO REPORT IMMEDIATELY:   Following lower endoscopy (colonoscopy or flexible sigmoidoscopy):  Excessive amounts of blood in the stool  Significant tenderness or worsening of abdominal  pains  Swelling of the abdomen that is new, acute  Fever of 100F or higher   Following upper endoscopy (EGD)  Vomiting of blood or coffee ground material  New chest pain or pain under the shoulder blades  Painful or persistently difficult swallowing  New shortness of breath  Fever of 100F or higher  Black, tarry-looking stools  For urgent or emergent issues, a gastroenterologist can be reached at any hour by calling [phone].   DIET:  We do recommend a small meal at first, but then you may proceed to your regular diet.  Drink plenty of fluids but you should avoid alcoholic beverages for 24 hours.  ACTIVITY:  You should plan to take it easy for the rest of today and you should NOT DRIVE or use heavy machinery until tomorrow (because of the sedation medicines used during the test).    FOLLOW UP: Our staff will call the number listed on your records the next business day following your procedure to check on you and address any questions or concerns that you may have regarding the information given to you following your procedure. If we do not reach you, we will leave a message.  However, if you are feeling well and you are not experiencing any problems, there is no need to return our call.  We will assume that you have returned to your regular daily activities without incident.  If any biopsies were taken you will  be contacted by phone or by letter within the next 1-3 weeks.  Please call us at [phone] if you have not heard about the biopsies in 3 weeks.    SIGNATURES/CONFIDENTIALITY: You and/or your care partner have signed paperwork which will be entered into your electronic medical record.  These signatures attest to the fact that that the information above on your After Visit Summary has been reviewed and is understood.  Full responsibility of the confidentiality of this discharge information lies with you and/or your care-partner.   To lab prior to discharge  Endoscopy  to be scheduled after lab reports are studied.

## 2018-06-10 NOTE — Telephone Encounter (Signed)
-----   Message from Iva Booparl E Gessner, MD sent at 06/10/2018  1:26 PM EDT ----- Regarding: needs MR brain He needs MR brain without and with IV contrast  Dx: 1) head trauma 2) seizure 3) nausea and vomiting 4) loss of consciousness  Has had 2 negative CT head last year   Challenge is to try to get by Thursday or Friday this week please  Thanks  CEG

## 2018-06-10 NOTE — Progress Notes (Signed)
Spontaneous respirations throughout. VSS. Resting comfortably. To PACU on room air. Report to  RN. 

## 2018-06-10 NOTE — Op Note (Signed)
[facility] Patient Name: Sean Torres Procedure Date: 06/10/2018 10:26 AM MRN: [id number] Endoscopist: Iva Boop , MD Age: 42 Referring MD:  Date of Birth: 03-19-1976 Gender: Male Account #: [id number] Procedure:                Colonoscopy Indications:              Clinically significant diarrhea of unexplained                            origin, Hematochezia Medicines:                Propofol per Anesthesia, Monitored Anesthesia Care Procedure:                Pre-Anesthesia Assessment:                           - Prior to the procedure, a History and Physical                            was performed, and patient medications and                            allergies were reviewed. The patient's tolerance of                            previous anesthesia was also reviewed. The risks                            and benefits of the procedure and the sedation                            options and risks were discussed with the patient.                            All questions were answered, and informed consent                            was obtained. Prior Anticoagulants: The patient has                            taken no previous anticoagulant or antiplatelet                            agents. ASA Grade Assessment: II - A patient with                            mild systemic disease. After reviewing the risks                            and benefits, the patient was deemed in                            satisfactory condition to undergo the procedure.  After obtaining informed consent, the colonoscope                            was passed under direct vision. Throughout the                            procedure, the patient's blood pressure, pulse, and                            oxygen saturations were monitored continuously. The                            Colonoscope was introduced through the anus and                            advanced to the the  terminal ileum, with                            identification of the appendiceal orifice and IC                            valve. The colonoscopy was performed without                            difficulty. The patient tolerated the procedure                            well. The quality of the bowel preparation was                            good. The terminal ileum, ileocecal valve,                            appendiceal orifice, and rectum were photographed.                            The bowel preparation used was Miralax. Scope In: 10:33:22 AM Scope Out: 10:44:32 AM Scope Withdrawal Time: 0 hours 7 minutes 23 seconds  Total Procedure Duration: 0 hours 11 minutes 10 seconds  Findings:                 The perianal and digital rectal examinations were                            normal. Pertinent negatives include normal prostate                            (size, shape, and consistency).                           The terminal ileum appeared normal.                           The colon (entire examined portion) appeared  normal. Biopsies for histology were taken with a                            cold forceps from the right colon and left colon                            for evaluation of microscopic colitis. Verification                            of patient identification for the specimen was                            done. Estimated blood loss was minimal.                           Internal hemorrhoids were found. Complications:            No immediate complications. Estimated Blood Loss:     Estimated blood loss was minimal. Impression:               - The examined portion of the ileum was normal.                           - The entire examined colon is normal. Biopsied.                           - Internal hemorrhoids. Recommendation:           - Patient has a contact number available for                            emergencies. The signs and symptoms of  potential                            delayed complications were discussed with the                            patient. Return to normal activities tomorrow.                            Written discharge instructions were provided to the                            patient.                           - Resume previous diet.                           - Continue present medications.                           - Await pathology results.                           - Check ANA, ferritin, HBV/HCV, celiac tests today  given abnl transaminases and diarrhea                           I will call with results                           consider prior concussion as a possible cause of                            nausea and vomiting                           will plan to move up his EGD appointment after i                            review labs and discuss, and after biopsies back Iva Boop, MD 06/10/2018 11:00:06 AM This report has been signed electronically.

## 2018-06-10 NOTE — Progress Notes (Signed)
Called to room to assist during endoscopic procedure.  Patient ID and intended procedure confirmed with present staff. Received instructions for my participation in the procedure from the performing physician.  

## 2018-06-11 ENCOUNTER — Telehealth: Payer: Self-pay | Admitting: *Deleted

## 2018-06-11 NOTE — Telephone Encounter (Signed)
  Follow up Call-  Call back number 06/10/2018  Post procedure Call Back phone  # [phone]  Permission to leave phone message Yes  Some recent data might be hidden     Patient questions:  Do you have a fever, pain , or abdominal swelling? No. Pain Score  0 *  Have you tolerated food without any problems? Yes.    Have you been able to return to your normal activities? Yes.    Do you have any questions about your discharge instructions: Diet   No. Medications  No. Follow up visit  No.  Do you have questions or concerns about your Care? No.  Actions: * If pain score is 4 or above: No action needed, pain <4.

## 2018-06-11 NOTE — Telephone Encounter (Signed)
  Follow up Call-  Call back number 06/10/2018  Post procedure Call Back phone  # [phone]  Permission to leave phone message Yes  Some recent data might be hidden     Patient questions:  The VM mailbox is full.

## 2018-06-12 ENCOUNTER — Telehealth: Payer: Self-pay | Admitting: Nurse Practitioner

## 2018-06-12 LAB — ANA: ANA: NEGATIVE

## 2018-06-12 LAB — HEPATITIS C ANTIBODY
Hepatitis C Ab: NONREACTIVE
SIGNAL TO CUT-OFF: 0.01 (ref <1.00)

## 2018-06-12 LAB — HEPATITIS B CORE ANTIBODY, TOTAL: HEP B C TOTAL AB: NONREACTIVE

## 2018-06-12 LAB — HEPATITIS B SURFACE ANTIGEN: Hepatitis B Surface Ag: NONREACTIVE

## 2018-06-12 NOTE — Telephone Encounter (Signed)
Please advise. Thank you

## 2018-06-13 ENCOUNTER — Telehealth: Payer: Self-pay | Admitting: Internal Medicine

## 2018-06-13 NOTE — Telephone Encounter (Signed)
I faxed the order to [place].

## 2018-06-13 NOTE — Progress Notes (Signed)
Let Sean Torres know he does not have infectious hepatitis Iron levels very high which goes along with liver inflammation Needs further work-up which I will discuss - will call him next week or after brain MRI which is pending I believe he is leaving or already in [place] FYI if you cannot reach him

## 2018-06-13 NOTE — Telephone Encounter (Signed)
It looks like you were working on this yesterday.

## 2018-06-13 NOTE — Telephone Encounter (Signed)
Pt spoke with his insurance and was told that his out of pocket for MRI scheduled tomorrow [facility] would be over $2000 dolars but if it is scheduled and [facility] [facility] would be a lot less. Pt called [facility] and they are holding an appt on Sunday for him but they need order faxed over right away.

## 2018-06-14 ENCOUNTER — Ambulatory Visit (HOSPITAL_BASED_OUTPATIENT_CLINIC_OR_DEPARTMENT_OTHER): Payer: BLUE CROSS/BLUE SHIELD

## 2018-06-16 ENCOUNTER — Other Ambulatory Visit: Payer: Self-pay

## 2018-06-16 ENCOUNTER — Telehealth: Payer: Self-pay

## 2018-06-16 DIAGNOSIS — S0990XD Unspecified injury of head, subsequent encounter: Secondary | ICD-10-CM

## 2018-06-16 MED ORDER — LORAZEPAM 1 MG PO TABS: ORAL_TABLET | ORAL | 0 refills | Status: DC

## 2018-06-16 NOTE — Telephone Encounter (Signed)
Yes, he can have 1 mg of ativan po 30 min prior to test but must have driver. Thanks

## 2018-06-16 NOTE — Telephone Encounter (Signed)
[facility] 06/17/18 at 6:50 pm. Okay to give medication for his anxiety during the procedure?

## 2018-06-16 NOTE — Telephone Encounter (Signed)
Ativan called in to the pharmacy.  Patient notified by phone.

## 2018-06-17 ENCOUNTER — Ambulatory Visit
Admission: RE | Admit: 2018-06-17 | Discharge: 2018-06-17 | Disposition: A | Discharge status: Home / Self Care | Payer: BLUE CROSS/BLUE SHIELD | Source: Ambulatory Visit | Attending: Internal Medicine | Admitting: Internal Medicine

## 2018-06-17 DIAGNOSIS — S060X9D Concussion with loss of consciousness of unspecified duration, subsequent encounter: Secondary | ICD-10-CM

## 2018-06-17 DIAGNOSIS — R112 Nausea with vomiting, unspecified: Secondary | ICD-10-CM

## 2018-06-17 DIAGNOSIS — S0990XA Unspecified injury of head, initial encounter: Secondary | ICD-10-CM | POA: Diagnosis not present

## 2018-06-17 DIAGNOSIS — R569 Unspecified convulsions: Secondary | ICD-10-CM | POA: Diagnosis not present

## 2018-06-17 DIAGNOSIS — S0990XD Unspecified injury of head, subsequent encounter: Secondary | ICD-10-CM

## 2018-06-17 MED ORDER — GADOBENATE DIMEGLUMINE 529 MG/ML IV SOLN
20.0000 mL | Freq: Once | INTRAVENOUS | Status: AC | PRN
  Administered 2018-06-17: 20 mL via INTRAVENOUS

## 2018-06-17 NOTE — Progress Notes (Signed)
Let patient know colon biopsies normal No recall or [facility]

## 2018-06-18 ENCOUNTER — Telehealth: Payer: Self-pay | Admitting: Internal Medicine

## 2018-06-18 NOTE — Telephone Encounter (Signed)
I did give him the results of the MRI, all negative, because he is going out of town in the morning to celebrate his birthday by jumping out of a helicopter.

## 2018-07-24 ENCOUNTER — Encounter: Payer: Self-pay | Admitting: Internal Medicine

## 2018-08-07 ENCOUNTER — Ambulatory Visit (AMBULATORY_SURGERY_CENTER): Payer: BLUE CROSS/BLUE SHIELD | Admitting: Internal Medicine

## 2018-08-07 ENCOUNTER — Encounter: Payer: Self-pay | Admitting: Internal Medicine

## 2018-08-07 VITALS — BP 135/83 | HR 78 | Temp 98.4°F | Resp 13 | Ht 70.0 in | Wt 215.0 lb

## 2018-08-07 DIAGNOSIS — R748 Abnormal levels of other serum enzymes: Secondary | ICD-10-CM

## 2018-08-07 DIAGNOSIS — R112 Nausea with vomiting, unspecified: Secondary | ICD-10-CM

## 2018-08-07 DIAGNOSIS — K317 Polyp of stomach and duodenum: Secondary | ICD-10-CM | POA: Diagnosis not present

## 2018-08-07 MED ORDER — SODIUM CHLORIDE 0.9 % IV SOLN: 500.0000 mL | Freq: Once | INTRAVENOUS | Status: DC

## 2018-08-07 MED ORDER — PANTOPRAZOLE SODIUM 40 MG PO TBEC: 40.0000 mg | DELAYED_RELEASE_TABLET | Freq: Two times a day (BID) | ORAL | 1 refills | Status: DC

## 2018-08-07 NOTE — Op Note (Addendum)
[facility] Patient Name: Sean Torres Procedure Date: 08/07/2018 10:23 AM MRN: [id number] Endoscopist: Iva Boop , MD Age: 42 Referring MD:  Date of Birth: [birth year] Gender: Male Account #: [id number] Procedure:                Upper GI endoscopy Indications:              Nausea with vomiting, Persistent vomiting Medicines:                Propofol per Anesthesia, Monitored Anesthesia Care Procedure:                Pre-Anesthesia Assessment:                           - Prior to the procedure, a History and Physical                            was performed, and patient medications and                            allergies were reviewed. The patient's tolerance of                            previous anesthesia was also reviewed. The risks                            and benefits of the procedure and the sedation                            options and risks were discussed with the patient.                            All questions were answered, and informed consent                            was obtained. Prior Anticoagulants: The patient has                            taken no previous anticoagulant or antiplatelet                            agents. ASA Grade Assessment: II - A patient with                            mild systemic disease. After reviewing the risks                            and benefits, the patient was deemed in                            satisfactory condition to undergo the procedure.                           After obtaining informed consent, the endoscope was  passed under direct vision. Throughout the                            procedure, the patient's blood pressure, pulse, and                            oxygen saturations were monitored continuously. The                            Endoscope was introduced through the mouth, and                            advanced to the second part of duodenum. The upper         GI endoscopy was accomplished without difficulty.                            The patient tolerated the procedure well. Scope In: Scope Out: Findings:                 A few diminutive semi-sessile polyps with no                            stigmata of recent bleeding were found in the                            gastric body. Biopsies were taken with a cold                            forceps for histology. Verification of patient                            identification for the specimen was done. Estimated                            blood loss was minimal.                           A small hiatal hernia was present.                           The Z-line was irregular.                           The exam was otherwise without abnormality.                           The cardia and gastric fundus were otherwise normal                            on retroflexion. Complications:            No immediate complications. Estimated Blood Loss:     Estimated blood loss was minimal. Impression:               - A few gastric polyps. Biopsied. LOOK LIKE  INNOCENT FUNDIC GLAND POLYPS                           - Small hiatal hernia.                           - Z-line irregular.                           - The examination was otherwise normal. Recommendation:           - Patient has a contact number available for                            emergencies. The signs and symptoms of potential                            delayed complications were discussed with the                            patient. Return to normal activities tomorrow.                            Written discharge instructions were provided to the                            patient.                           - Resume previous diet.                           - Continue present medications.                           - Await pathology results.                           - Go to lab - check LFT's today                            - Will switch PPI to bid pantoprazole - having                            bloating and gas - ? from Prilosec + having                            breakthrough sxs at night                           ? if he would be a TIF candidate - HH may be too                            big/borderline                           Will discuss  he will do LFT's before end of October Iva Boop, MD 08/07/2018 10:53:21 AM This report has been signed electronically.

## 2018-08-07 NOTE — Progress Notes (Signed)
Alert and oriented x 3, pleased with MAC, report to RN

## 2018-08-07 NOTE — Progress Notes (Signed)
Called to room to assist during endoscopic procedure.  Patient ID and intended procedure confirmed with present staff. Received instructions for my participation in the procedure from the performing physician.  

## 2018-08-07 NOTE — Patient Instructions (Addendum)
I found some small gastric polyps and took biopsies.They look innocent. I will let you know.  You also have a small hiatal hernia. Stomach moves from abdomen and into the chest. Very common. Doubt it is causing problems but can be linked to reflux.  Please go to the lab within 30 days from today -  recheck liver tests.  I appreciate the opportunity to care for you. Iva Boop, MD, FACG  YOU HAD AN ENDOSCOPIC PROCEDURE TODAY AT THE  ENDOSCOPY CENTER:   Refer to the procedure report that was given to you for any specific questions about what was found during the examination.  If the procedure report does not answer your questions, please call your gastroenterologist to clarify.  If you requested that your care partner not be given the details of your procedure findings, then the procedure report has been included in a sealed envelope for you to review at your convenience later.  YOU SHOULD EXPECT: Some feelings of bloating in the abdomen. Passage of more gas than usual.  Walking can help get rid of the air that was put into your GI tract during the procedure and reduce the bloating. If you had a lower endoscopy (such as a colonoscopy or flexible sigmoidoscopy) you may notice spotting of blood in your stool or on the toilet paper. If you underwent a bowel prep for your procedure, you may not have a normal bowel movement for a few days.  Please Note:  You might notice some irritation and congestion in your nose or some drainage.  This is from the oxygen used during your procedure.  There is no need for concern and it should clear up in a day or so.  SYMPTOMS TO REPORT IMMEDIATELY:   Following upper endoscopy (EGD)  Vomiting of blood or coffee ground material  New chest pain or pain under the shoulder blades  Painful or persistently difficult swallowing  New shortness of breath  Fever of 100F or higher  Black, tarry-looking stools  For urgent or emergent issues, a  gastroenterologist can be reached at any hour by calling [phone].   DIET:  We do recommend a small meal at first, but then you may proceed to your regular diet.  Drink plenty of fluids but you should avoid alcoholic beverages for 24 hours.  ACTIVITY:  You should plan to take it easy for the rest of today and you should NOT DRIVE or use heavy machinery until tomorrow (because of the sedation medicines used during the test).    FOLLOW UP: Our staff will call the number listed on your records the next business day following your procedure to check on you and address any questions or concerns that you may have regarding the information given to you following your procedure. If we do not reach you, we will leave a message.  However, if you are feeling well and you are not experiencing any problems, there is no need to return our call.  We will assume that you have returned to your regular daily activities without incident.  If any biopsies were taken you will be contacted by phone or by letter within the next 1-3 weeks.  Please call us at [phone] if you have not heard about the biopsies in 3 weeks.    SIGNATURES/CONFIDENTIALITY: You and/or your care partner have signed paperwork which will be entered into your electronic medical record.  These signatures attest to the fact that that the information above on your  After Visit Summary has been reviewed and is understood.  Full responsibility of the confidentiality of this discharge information lies with you and/or your care-partner.

## 2018-08-08 ENCOUNTER — Telehealth: Payer: Self-pay

## 2018-08-08 NOTE — Telephone Encounter (Signed)
  Follow up Call-  Call back number 08/07/2018 06/10/2018  Post procedure Call Back phone  # [phone]  Permission to leave phone message Yes Yes  Some recent data might be hidden     Patient questions:  Do you have a fever, pain , or abdominal swelling? No. Pain Score  0 *  Have you tolerated food without any problems? Yes.    Have you been able to return to your normal activities? Yes.    Do you have any questions about your discharge instructions: Diet   No. Medications  No. Follow up visit  No.  Do you have questions or concerns about your Care? No.  Actions: * If pain score is 4 or above: No action needed, pain <4.

## 2018-08-08 NOTE — Telephone Encounter (Signed)
  Follow up Call-  Call back number 08/07/2018 06/10/2018  Post procedure Call Back phone  # [phone]  Permission to leave phone message Yes Yes  Some recent data might be hidden    Left message

## 2018-08-19 ENCOUNTER — Encounter: Payer: Self-pay | Admitting: Internal Medicine

## 2018-08-19 NOTE — Progress Notes (Signed)
BIOPSIES NEGATIVE LETTER SENT

## 2018-11-03 ENCOUNTER — Emergency Department (HOSPITAL_COMMUNITY)
Admission: EM | Admit: 2018-11-03 | Discharge: 2018-11-04 | Discharge status: Against Medical Advice | Payer: BLUE CROSS/BLUE SHIELD | Attending: Emergency Medicine | Admitting: Emergency Medicine

## 2018-11-03 ENCOUNTER — Encounter (HOSPITAL_COMMUNITY): Payer: Self-pay

## 2018-11-03 ENCOUNTER — Other Ambulatory Visit: Payer: Self-pay

## 2018-11-03 DIAGNOSIS — Z5321 Procedure and treatment not carried out due to patient leaving prior to being seen by health care provider: Secondary | ICD-10-CM | POA: Insufficient documentation

## 2018-11-03 DIAGNOSIS — R42 Dizziness and giddiness: Secondary | ICD-10-CM | POA: Diagnosis not present

## 2018-11-03 LAB — URINALYSIS, ROUTINE W REFLEX MICROSCOPIC
BILIRUBIN URINE: NEGATIVE
Glucose, UA: NEGATIVE mg/dL
HGB URINE DIPSTICK: NEGATIVE
KETONES UR: NEGATIVE mg/dL
Leukocytes, UA: NEGATIVE
Nitrite: NEGATIVE
Protein, ur: NEGATIVE mg/dL
Specific Gravity, Urine: 1.011 (ref 1.005–1.030)
pH: 5 (ref 5.0–8.0)

## 2018-11-03 LAB — CBC
HCT: 46.6 % (ref 39.0–52.0)
Hemoglobin: 15.2 g/dL (ref 13.0–17.0)
MCH: 32.5 pg (ref 26.0–34.0)
MCHC: 32.6 g/dL (ref 30.0–36.0)
MCV: 99.6 fL (ref 80.0–100.0)
Platelets: 235 10*3/uL (ref 150–400)
RBC: 4.68 MIL/uL (ref 4.22–5.81)
RDW: 11.9 % (ref 11.5–15.5)
WBC: 6.2 10*3/uL (ref 4.0–10.5)
nRBC: 0 % (ref 0.0–0.2)

## 2018-11-03 LAB — BASIC METABOLIC PANEL
Anion gap: 11 (ref 5–15)
BUN: 9 mg/dL (ref 6–20)
CO2: 21 mmol/L — ABNORMAL LOW (ref 22–32)
Calcium: 9 mg/dL (ref 8.9–10.3)
Chloride: 111 mmol/L (ref 98–111)
Creatinine, Ser: 0.98 mg/dL (ref 0.61–1.24)
GFR calc Af Amer: >60 mL/min (ref >60)
GFR calc non Af Amer: >60 mL/min (ref >60)
Glucose, Bld: 117 mg/dL — ABNORMAL HIGH (ref 70–99)
Potassium: 3.9 mmol/L (ref 3.5–5.1)
Sodium: 143 mmol/L (ref 135–145)

## 2018-11-03 NOTE — ED Triage Notes (Signed)
Pt was playing with daughter and hit his head.  States he lost consciousness. Felt nauseated the ride to the hospital.  A&Ox4, hx of concussions.

## 2018-11-04 NOTE — ED Notes (Signed)
Pt called for room x3. No answer. 

## 2019-05-22 ENCOUNTER — Other Ambulatory Visit: Payer: Self-pay | Admitting: Internal Medicine

## 2019-07-08 DIAGNOSIS — H9192 Unspecified hearing loss, left ear: Secondary | ICD-10-CM | POA: Diagnosis not present

## 2019-07-08 DIAGNOSIS — H9312 Tinnitus, left ear: Secondary | ICD-10-CM | POA: Diagnosis not present

## 2019-07-23 DIAGNOSIS — H903 Sensorineural hearing loss, bilateral: Secondary | ICD-10-CM | POA: Diagnosis not present

## 2019-07-23 DIAGNOSIS — H9312 Tinnitus, left ear: Secondary | ICD-10-CM | POA: Diagnosis not present

## 2019-07-23 DIAGNOSIS — H9120 Sudden idiopathic hearing loss, unspecified ear: Secondary | ICD-10-CM | POA: Diagnosis not present

## 2019-07-28 ENCOUNTER — Other Ambulatory Visit: Payer: Self-pay | Admitting: Physician Assistant

## 2019-07-28 DIAGNOSIS — H9120 Sudden idiopathic hearing loss, unspecified ear: Secondary | ICD-10-CM

## 2019-07-28 DIAGNOSIS — H9312 Tinnitus, left ear: Secondary | ICD-10-CM

## 2019-07-29 ENCOUNTER — Other Ambulatory Visit: Payer: BLUE CROSS/BLUE SHIELD

## 2019-08-09 DIAGNOSIS — R0609 Other forms of dyspnea: Secondary | ICD-10-CM | POA: Insufficient documentation

## 2019-08-09 DIAGNOSIS — G4733 Obstructive sleep apnea (adult) (pediatric): Secondary | ICD-10-CM | POA: Insufficient documentation

## 2019-08-09 DIAGNOSIS — I10 Essential (primary) hypertension: Secondary | ICD-10-CM | POA: Insufficient documentation

## 2019-08-09 DIAGNOSIS — E669 Obesity, unspecified: Secondary | ICD-10-CM | POA: Insufficient documentation

## 2019-08-09 DIAGNOSIS — F1020 Alcohol dependence, uncomplicated: Secondary | ICD-10-CM | POA: Insufficient documentation

## 2019-08-09 DIAGNOSIS — R0600 Dyspnea, unspecified: Secondary | ICD-10-CM | POA: Insufficient documentation

## 2019-08-09 DIAGNOSIS — R7401 Elevation of levels of liver transaminase levels: Secondary | ICD-10-CM | POA: Insufficient documentation

## 2019-08-12 DIAGNOSIS — F411 Generalized anxiety disorder: Secondary | ICD-10-CM | POA: Diagnosis not present

## 2019-08-12 DIAGNOSIS — F331 Major depressive disorder, recurrent, moderate: Secondary | ICD-10-CM | POA: Diagnosis not present

## 2019-08-12 DIAGNOSIS — I10 Essential (primary) hypertension: Secondary | ICD-10-CM | POA: Diagnosis not present

## 2019-08-17 ENCOUNTER — Ambulatory Visit
Admission: RE | Admit: 2019-08-17 | Discharge: 2019-08-17 | Disposition: A | Discharge status: Home / Self Care | Payer: BC Managed Care – PPO | Source: Ambulatory Visit | Attending: Physician Assistant | Admitting: Physician Assistant

## 2019-08-17 ENCOUNTER — Other Ambulatory Visit: Payer: Self-pay

## 2019-08-17 DIAGNOSIS — H9120 Sudden idiopathic hearing loss, unspecified ear: Secondary | ICD-10-CM

## 2019-08-17 DIAGNOSIS — H9312 Tinnitus, left ear: Secondary | ICD-10-CM

## 2019-08-27 ENCOUNTER — Encounter: Payer: Self-pay | Admitting: Neurology

## 2019-08-27 ENCOUNTER — Other Ambulatory Visit: Payer: Self-pay

## 2019-08-27 ENCOUNTER — Ambulatory Visit: Payer: 59 | Admitting: Neurology

## 2019-08-27 VITALS — BP 116/80 | HR 75 | Temp 97.7°F | Ht 72.0 in | Wt 222.0 lb

## 2019-08-27 DIAGNOSIS — Z820 Family history of epilepsy and other diseases of the nervous system: Secondary | ICD-10-CM

## 2019-08-27 DIAGNOSIS — R635 Abnormal weight gain: Secondary | ICD-10-CM | POA: Diagnosis not present

## 2019-08-27 DIAGNOSIS — G4733 Obstructive sleep apnea (adult) (pediatric): Secondary | ICD-10-CM

## 2019-08-27 NOTE — Progress Notes (Signed)
Subjective:    Patient ID: Sean Torres is a 43 y.o. male.  HPI      Sean Age, MD, PhD [facility] [address], [address].O. [place], [place] [address]  Dear Sean Torres, I saw your patient, Sean Torres, upon your kind request in my sleep clinic today for reevaluation of his obstructive sleep apnea.  The patient is unaccompanied today.  As you know, Sean Torres is a 43 year old right-handed gentleman with an underlying disease, anxiety, depression, low back pain with status post lumbar spine surgery in April 2016 and borderline obesity, who reports ongoing issues with his sleep including snoring and restless sleep.  He does not necessarily wake up rested.  His Epworth sleepiness score is 2 out of 24, fatigue severity score is 49 out of 63.  He had a sleep study in the past, I met him over 4 years ago for sleep evaluation, baseline sleep study from 09/07/2015 showed mild to moderate obstructive sleep apnea, AHI was 13.3/h, supine AHI was 21.5/h, REM AHI was 22.6/h, O2 nadir was 83%.  He had an attempted CPAP titration study on 10/04/2015 but left after a total recording time of 97.5 minutes.  He was offered a follow-up appointment but did not follow up. I reviewed your telemedicine note from 08/12/2019.  He has been started on olmesartan-hydrochlorothiazide as well as sertraline.  He is no longer taking Lyrica or any narcotic pain medication.  He does not take anything to help him sleep.  He has noticed some difficulty falling asleep lately.  He is in bed between 930 and 10:30 PM, rise time is between 6 and 730.  He lives with his significant other.  He quit smoking over 20 years ago and was an intermittent smoker.  He does not currently drink any alcohol, does not drink any caffeine on a day-to-day basis.  He has 2 daughters, ages 3 and 41.  They have 3 dogs in the household, typically in their bedroom at night and on the bed, these are older dogs that do not bother him.  His  father has sleep apnea and uses a CPAP machine.  The patient reports that he has narrow nasal passages and was in the past interested in pursuing surgical treatment option for sleep apnea.  He reports that he tried a friend's CPAP machine recently and did help him sleep and he felt good.  He is discouraged from using someone else's medical equipment.  Previously:   08/23/2015: 43 year old right-handed gentleman with an underlying medical history of overweight state, allergies, reflux disease, low back pain on Lyrica until recently (and previously on high dose dilaudid), status post lumbar spine surgery in April 2016, who reports snoring, excessive daytime somnolence and breathing pauses while asleep per girlfriend. I reviewed your office note from 08/02/2015, which you kindly included.  He reports a bedtime between 9:30 and 11 PM usually. He falls asleep quickly. He has sleep disruption from snoring, sense of gasping for air, choking sensation, and going to the bathroom. He has nocturia on average 2-3 times per night. He denies restless leg symptoms but is a restless sleeper and reportedly moves his legs in his sleep. He denies any other parasomnias. He denies morning headaches. His father has sleep apnea and uses a CPAP machine. He has gained weight in the last 7 months in the realm of 30 pounds. He has weaned himself off of Lyrica and has been off of it for the past week. He he has  had excessive sweating especially first thing in the morning or at night. He has had his thyroid  Labs checked from what I understand about a month ago which were fine per his report. He has had some allergy symptoms. He wakes up with mucus and easily gags in the mornings. He has nasal congestion and postnasal drip. His Epworth sleepiness score is 17 out of 24 today, his fatigue score is 47 out of 63. He lives alone. He runs his own business. He has 2 children ages 66 and 41. He was on Dilaudid up to 32 mg daily which he stopped in  May 2016. He sees a pain management doctor out of [facility].  08/23/2015: He reports snoring, excessive daytime somnolence and breathing pauses while asleep per girlfriend. I reviewed your office note from 08/02/2015, which you kindly included.  He reports a bedtime between 9:30 and 11 PM usually. He falls asleep quickly. He has sleep disruption from snoring, sense of gasping for air, choking sensation, and going to the bathroom. He has nocturia on average 2-3 times per night. He denies restless leg symptoms but is a restless sleeper and reportedly moves his legs in his sleep. He denies any other parasomnias. He denies morning headaches. His father has sleep apnea and uses a CPAP machine. He has gained weight in the last 7 months in the realm of 30 pounds. He has weaned himself off of Lyrica and has been off of it for the past week. He he has had excessive sweating especially first thing in the morning or at night. He has had his thyroid  Labs checked from what I understand about a month ago which were fine per his report. He has had some allergy symptoms. He wakes up with mucus and easily gags in the mornings. He has nasal congestion and postnasal drip. His Epworth sleepiness score is 17 out of 24 today, his fatigue score is 47 out of 63. He lives alone. He runs his own business. He has 2 children ages 31 and 41. He was on Dilaudid up to 32 mg daily which he stopped in May 2016. He sees a pain management doctor out of [facility].  His Past Medical History Is Significant For: Past Medical History:  Diagnosis Date  . Allergy    seasonal  . Hypertension     His Past Surgical History Is Significant For: Past Surgical History:  Procedure Laterality Date  . COLONOSCOPY  06/2018  . LUMBAR DISC SURGERY      His Family History Is Significant For: Family History  Problem Relation Torres of Onset  . Diabetes Mother   . Diabetes Father   . Heart disease Father   . Diabetes Maternal Grandmother   .  Diabetes Maternal Grandfather   . Heart attack Maternal Grandfather   . Diabetes Paternal Grandmother   . Throat cancer Paternal Grandmother   . Diabetes Paternal Grandfather   . Heart attack Paternal Grandfather   . Colon cancer Paternal Grandfather   . Colon polyps Paternal Grandfather   . Rectal cancer Neg Hx   . Stomach cancer Neg Hx     His Social History Is Significant For: Social History   Socioeconomic History  . Marital status: Single    Spouse name: Not on file  . Number of children: 2  . Years of education: College  . Highest education level: Not on file  Occupational History  . Occupation: [employer]  Social Needs  . Financial resource strain: Not  on file  . Food insecurity    Worry: Not on file    Inability: Not on file  . Transportation needs    Medical: Not on file    Non-medical: Not on file  Tobacco Use  . Smoking status: Former Smoker    Years: 4.00  . Smokeless tobacco: Never Used  . Tobacco comment: Quit 2004   Substance and Sexual Activity  . Alcohol use: Yes    Alcohol/week: 0.0 standard drinks  . Drug use: No  . Sexual activity: Not on file  Lifestyle  . Physical activity    Days per week: Not on file    Minutes per session: Not on file  . Stress: Not on file  Relationships  . Social Herbalist on phone: Not on file    Gets together: Not on file    Attends religious service: Not on file    Active member of club or organization: Not on file    Attends meetings of clubs or organizations: Not on file    Relationship status: Not on file  Other Topics Concern  . Not on file  Social History Narrative   Drinks about 2-3 cups of tea a week     His Allergies Are:  Allergies  Allergen Reactions  . Erythromycin Base Itching and Rash  :   His Current Medications Are:  Outpatient Encounter Medications as of 08/27/2019  Medication Sig  . amLODipine (NORVASC) 5 MG tablet Take 5 mg by mouth daily.  Marland Kitchen BIOTIN PO Take 1 tablet by  mouth.  . co-enzyme Q-10 30 MG capsule Take 30 mg by mouth daily.  . Cyanocobalamin (VITAMIN B-12 PO) Take 1 tablet by mouth daily.  . fluticasone (FLONASE) 50 MCG/ACT nasal spray Place into both nostrils daily.  Marland Kitchen loratadine (CLARITIN) 10 MG tablet Take 10 mg by mouth daily.  Marland Kitchen LORazepam (ATIVAN) 1 MG tablet Take 1 tablet by mouth 30 minutes prior to MRI.  You must have a driver to and from the test.  . Multiple Vitamin (MULTIVITAMIN WITH MINERALS) TABS tablet Take 1 tablet by mouth daily.  Marland Kitchen olmesartan-hydrochlorothiazide (BENICAR HCT) 20-12.5 MG tablet Take 1 tablet by mouth daily.  . Omega-3 Fatty Acids (FISH OIL PO) Take 1 tablet by mouth 3 (three) times daily.  . ondansetron (ZOFRAN ODT) 4 MG disintegrating tablet Take 1 tablet (4 mg total) by mouth every 8 (eight) hours as needed for nausea or vomiting.  . pantoprazole (PROTONIX) 40 MG tablet TAKE 1 TABLET(40 MG) BY MOUTH TWICE DAILY BEFORE A MEAL  . pregabalin (LYRICA) 150 MG capsule Take 150 mg by mouth 4 (four) times daily.  . sertraline (ZOLOFT) 25 MG tablet Take 50 mg by mouth daily.  . traMADol (ULTRAM) 50 MG tablet Take 50 mg by mouth every 6 (six) hours as needed for moderate pain.   No facility-administered encounter medications on file as of 08/27/2019.   :  Review of Systems:  Out of a complete 14 point review of systems, all are reviewed and negative with the exception of these symptoms as listed below:  Review of Systems  Neurological:       Pt presents today to discuss his sleep. Pt has had sleep studies at [facility] before but did not pursue the cpap. He tried his friend's cpap and liked it and is willing to try it again. Pt does endorse snoring.  Epworth Sleepiness Scale 0= would never doze 1= slight chance of dozing 2= moderate  chance of dozing 3= high chance of dozing  Sitting and reading: 0 Watching TV: 0 Sitting inactive in a public place (ex. Theater or meeting): 0 As a passenger in a car for an hour without a  break: 0 Lying down to rest in the afternoon: 2 Sitting and talking to someone: 0 Sitting quietly after lunch (no alcohol): 0 In a car, while stopped in traffic: 0 Total: 2     Objective:  Neurological Exam  Physical Exam Physical Examination:   Vitals:   08/27/19 0847  BP: 116/80  Pulse: 75  Temp: 97.7 F (36.5 C)    General Examination: The patient is a very pleasant 43 y.o. male in no acute distress. He appears well-developed and well-nourished and well groomed.   HEENT: Normocephalic, atraumatic, pupils are equal, round and reactive to light. Extraocular tracking is preserved, hearing is grossly intact, face is symmetric with normal facial animation, airway examination a moderately crowded airway, tonsillar size of about 2+, small airway entry, neck circumference of 18-1/8 inches.  Tongue protrudes centrally and palate elevates symmetrically.  He has a minimal overbite. Nasal inspection reveals no significant nasal mucosal bogginess or redness, narrow nasal passages.   Chest: Clear to auscultation without wheezing, rhonchi or crackles noted.  Heart: S1+S2+0, regular and normal without murmurs, rubs or gallops noted.   Abdomen: Soft, non-tender and non-distended with normal bowel sounds appreciated on auscultation.  Extremities: There is no pitting edema in the distal lower extremities bilaterally.  Skin: Warm and slightly sweaty.   Musculoskeletal: exam reveals no obvious joint deformities, tenderness or joint swelling or erythema.   Neurologically:  Mental status: The patient is awake, alert and oriented in all 4 spheres. His immediate and remote memory, attention, language skills and fund of knowledge are appropriate. There is no evidence of aphasia, agnosia, apraxia or anomia. Speech is clear with normal prosody and enunciation. Thought process is linear. Mood is normal and affect is normal.  Cranial nerves II - XII are as described above under HEENT exam.   Motor exam: Normal bulk, strength and tone is noted. There is no tremord. Romberg is negative. Fine motor skills and coordination: intact grossly.  Cerebellar testing: No dysmetria or intention tremor. There is no truncal or gait ataxia.  Sensory exam: intact to light touch.  Gait, station and balance: He stands easily. No veering to one side is noted. No leaning to one side is noted. Posture is Torres-appropriate and stance is narrow based. Gait shows normal stride length and normal pace. No problems turning are noted. Assessment and Plan:   In summary, Travez Stancil is a 43 year old gentleman with an underlying history of allergies, reflux disease, depression, anxiety, history of low back pain, status post lumbar spine surgery in April 2016 and borderline obesity, who presents for reevaluation of his obstructive sleep apnea.  He was diagnosed with mild to moderate obstructive sleep apnea in 2016.  He has gained a little bit of weight in the interim.  He would be willing to pursue sleep study testing and consider therapy in the form of a CPAP or AutoPap if the need arises.  We also talked about alternative treatment options in the form of an Oral appliance and surgical options including airway surgery versus implantable device such as inspire. I explained the risks and ramifications of untreated moderate to severe OSA, especially with respect to developing cardiovascular disease down the Road, including congestive heart failure, difficult to treat hypertension, cardiac arrhythmias, or stroke. Even type  2 diabetes has, in part, been linked to untreated OSA. Symptoms of untreated OSA include daytime sleepiness, memory problems, mood irritability and mood disorder such as depression and anxiety, lack of energy, as well as recurrent headaches, especially morning headaches. We talked about trying to maintain a healthy lifestyle in general, as well as the importance of weight control. I encouraged the patient to eat  healthy, exercise daily and keep well hydrated, to keep a scheduled bedtime and wake time routine, to not skip any meals and eat healthy snacks in between meals. I advised the patient not to drive when feeling sleepy. I plan to see him back after sleep study testing.  I answered all his questions today and he was in agreement. Thank you very much for allowing me to participate in the care of this nice patient. If I can be of any further assistance to you please do not hesitate to call me at [phone].  Sincerely,   Sean Age, MD, PhD

## 2019-08-31 ENCOUNTER — Other Ambulatory Visit: Payer: Self-pay | Admitting: Podiatry

## 2019-08-31 ENCOUNTER — Ambulatory Visit: Payer: BC Managed Care – PPO | Admitting: Podiatry

## 2019-08-31 ENCOUNTER — Telehealth: Payer: Self-pay | Admitting: Podiatry

## 2019-08-31 ENCOUNTER — Encounter: Payer: Self-pay | Admitting: Podiatry

## 2019-08-31 ENCOUNTER — Ambulatory Visit (INDEPENDENT_AMBULATORY_CARE_PROVIDER_SITE_OTHER): Payer: BC Managed Care – PPO

## 2019-08-31 ENCOUNTER — Other Ambulatory Visit: Payer: Self-pay

## 2019-08-31 DIAGNOSIS — M2022 Hallux rigidus, left foot: Secondary | ICD-10-CM

## 2019-08-31 DIAGNOSIS — M722 Plantar fascial fibromatosis: Secondary | ICD-10-CM

## 2019-08-31 NOTE — Telephone Encounter (Signed)
DOS: 09/22/2019 SURGICAL PROCEDURE: [name] Bi-Planar FGBM([id number]) DX CODE: Hallux Rigidus(M20.22)  Member Information   Member Number: [id number]  Policy Effective : 49/75/3005  -  11/05/2019   Name: Sean Torres  Date of Birth: 11-09-1975  Member Liability Summary       In-Network   Max Per Benefit Period Year-to-Date Remaining     CoInsurance 30%      Deductible $3500.00 $3500.00     Out-Of-Pocket $3000.00 $3000.00  [facility]ospital - [facility]        In Network Copay Coinsurance Authorization Required Not Applicable 11%  per  Service Year No

## 2019-08-31 NOTE — Progress Notes (Signed)
Subjective:   Patient ID: Sean Torres, male   DOB: 43 y.o.   MRN: [id number]   HPI Patient states this left foot is getting worse and it is making it hard for me to walk and states it seems like it is getting much worse my right 1 is doing great and I know I am not need surgery on this left 1   ROS      Objective:  Physical Exam  Neurovascular status intact with patient's left first MPJ found to have significant diminishment of range of motion with crepitus of the joint surface and pain with palpation.  Patient is noted to have good digital perfusion well oriented x3     Assessment:  Severe hallux limitus deformity left with loss of range of motion and pain with spur formation     Plan:  H&P reviewed condition and recommended a biplanar osteotomy with removal of dorsal bone spurs and I explained procedure to patient and patient understands surgery wants to have it done understands risk and at this point understands also long-term may require fusion or joint implantation.  Patient is willing to accept risk and was given consent form reviewing alternative treatments complications and after extensive review signed consent form with all instructions explained.  Scheduled for outpatient surgery and air fracture walker dispensed and understands recovery can take 6 months to 1 year  X-rays indicate that there is dorsal spur formation first metatarsal and into the joint with narrowing of the joint and flattening the joint surface

## 2019-08-31 NOTE — Patient Instructions (Signed)
Pre-Operative Instructions  Congratulations, you have decided to take an important step towards improving your quality of life.  You can be assured that the doctors and staff at [facility] will be with you every step of the way.  Here are some important things you should know:  1. Plan to be at the surgery center/hospital at least 1 (one) hour prior to your scheduled time, unless otherwise directed by the surgical center/hospital staff.  You must have a responsible adult accompany you, remain during the surgery and drive you home.  Make sure you have directions to the surgical center/hospital to ensure you arrive on time. 2. If you are having surgery at [facility] or [facility] hospitals, you will need a copy of your medical history and physical form from your family physician within one month prior to the date of surgery. We will give you a form for your primary physician to complete.  3. We make every effort to accommodate the date you request for surgery.  However, there are times where surgery dates or times have to be moved.  We will contact you as soon as possible if a change in schedule is required.   4. No aspirin/ibuprofen for one week before surgery.  If you are on aspirin, any non-steroidal anti-inflammatory medications (Mobic, Aleve, Ibuprofen) should not be taken seven (7) days prior to your surgery.  You make take Tylenol for pain prior to surgery.  5. Medications - If you are taking daily heart and blood pressure medications, seizure, reflux, allergy, asthma, anxiety, pain or diabetes medications, make sure you notify the surgery center/hospital before the day of surgery so they can tell you which medications you should take or avoid the day of surgery. 6. No food or drink after midnight the night before surgery unless directed otherwise by surgical center/hospital staff. 7. No alcoholic beverages 24-hours prior to surgery.  No smoking 24-hours prior or 24-hours after  surgery. 8. Wear loose pants or shorts. They should be loose enough to fit over bandages, boots, and casts. 9. Don't wear slip-on shoes. Sneakers are preferred. 10. Bring your boot with you to the surgery center/hospital.  Also bring crutches or a walker if your physician has prescribed it for you.  If you do not have this equipment, it will be provided for you after surgery. 11. If you have not been contacted by the surgery center/hospital by the day before your surgery, call to confirm the date and time of your surgery. 12. Leave-time from work may vary depending on the type of surgery you have.  Appropriate arrangements should be made prior to surgery with your employer. 13. Prescriptions will be provided immediately following surgery by your doctor.  Fill these as soon as possible after surgery and take the medication as directed. Pain medications will not be refilled on weekends and must be approved by the doctor. 14. Remove nail polish on the operative foot and avoid getting pedicures prior to surgery. 15. Wash the night before surgery.  The night before surgery wash the foot and leg well with water and the antibacterial soap provided. Be sure to pay special attention to beneath the toenails and in between the toes.  Wash for at least three (3) minutes. Rinse thoroughly with water and dry well with a towel.  Perform this wash unless told not to do so by your physician.  Enclosed: 1 Ice pack (please put in freezer the night before surgery)   1 Hibiclens skin cleaner     Pre-op instructions  If you have any questions regarding the instructions, please do not hesitate to call our office.  [place]: [address], [place], [place] [address] -- [phone]  [place]: [address] [place], [place] [address] -- [phone]  [place]: [address]  [place], [place] [address] -- [phone]   Website: [email or web address] 

## 2019-09-22 ENCOUNTER — Encounter: Payer: Self-pay | Admitting: Podiatry

## 2019-09-22 ENCOUNTER — Telehealth: Payer: Self-pay

## 2019-09-22 DIAGNOSIS — M2012 Hallux valgus (acquired), left foot: Secondary | ICD-10-CM | POA: Diagnosis not present

## 2019-09-22 DIAGNOSIS — M2022 Hallux rigidus, left foot: Secondary | ICD-10-CM | POA: Diagnosis not present

## 2019-09-22 DIAGNOSIS — K219 Gastro-esophageal reflux disease without esophagitis: Secondary | ICD-10-CM | POA: Diagnosis not present

## 2019-09-22 NOTE — Telephone Encounter (Signed)
We have attempted to call the patient three times to schedule sleep study.  Patient has been unavailable at the phone numbers we have on file and has not returned our calls.  If patient calls back we will schedule them for their sleep study.  

## 2019-09-28 ENCOUNTER — Ambulatory Visit (INDEPENDENT_AMBULATORY_CARE_PROVIDER_SITE_OTHER): Payer: BC Managed Care – PPO | Admitting: Podiatry

## 2019-09-28 ENCOUNTER — Ambulatory Visit (INDEPENDENT_AMBULATORY_CARE_PROVIDER_SITE_OTHER): Payer: BC Managed Care – PPO

## 2019-09-28 ENCOUNTER — Other Ambulatory Visit: Payer: Self-pay

## 2019-09-28 DIAGNOSIS — M722 Plantar fascial fibromatosis: Secondary | ICD-10-CM

## 2019-10-12 ENCOUNTER — Encounter: Payer: BC Managed Care – PPO | Admitting: Podiatry

## 2019-11-16 ENCOUNTER — Other Ambulatory Visit: Payer: Self-pay | Admitting: Internal Medicine

## 2019-12-04 DIAGNOSIS — I10 Essential (primary) hypertension: Secondary | ICD-10-CM | POA: Diagnosis not present

## 2019-12-04 DIAGNOSIS — F331 Major depressive disorder, recurrent, moderate: Secondary | ICD-10-CM | POA: Diagnosis not present

## 2019-12-04 DIAGNOSIS — F411 Generalized anxiety disorder: Secondary | ICD-10-CM | POA: Diagnosis not present

## 2019-12-16 ENCOUNTER — Ambulatory Visit: Payer: BC Managed Care – PPO | Attending: Internal Medicine

## 2019-12-16 ENCOUNTER — Ambulatory Visit: Payer: Self-pay

## 2019-12-16 DIAGNOSIS — Z20822 Contact with and (suspected) exposure to covid-19: Secondary | ICD-10-CM

## 2019-12-17 LAB — NOVEL CORONAVIRUS, NAA: SARS-CoV-2, NAA: NOT DETECTED

## 2020-03-07 DIAGNOSIS — Z03818 Encounter for observation for suspected exposure to other biological agents ruled out: Secondary | ICD-10-CM | POA: Diagnosis not present

## 2020-03-07 DIAGNOSIS — Z20828 Contact with and (suspected) exposure to other viral communicable diseases: Secondary | ICD-10-CM | POA: Diagnosis not present

## 2020-03-10 DIAGNOSIS — Z20828 Contact with and (suspected) exposure to other viral communicable diseases: Secondary | ICD-10-CM | POA: Diagnosis not present

## 2020-03-10 DIAGNOSIS — Z03818 Encounter for observation for suspected exposure to other biological agents ruled out: Secondary | ICD-10-CM | POA: Diagnosis not present

## 2020-03-13 DIAGNOSIS — Z20828 Contact with and (suspected) exposure to other viral communicable diseases: Secondary | ICD-10-CM | POA: Diagnosis not present

## 2020-03-13 DIAGNOSIS — Z20822 Contact with and (suspected) exposure to covid-19: Secondary | ICD-10-CM | POA: Diagnosis not present

## 2020-07-22 DIAGNOSIS — W57XXXA Bitten or stung by nonvenomous insect and other nonvenomous arthropods, initial encounter: Secondary | ICD-10-CM | POA: Diagnosis not present

## 2020-07-22 DIAGNOSIS — S30860A Insect bite (nonvenomous) of lower back and pelvis, initial encounter: Secondary | ICD-10-CM | POA: Diagnosis not present

## 2020-08-12 ENCOUNTER — Ambulatory Visit: Payer: BC Managed Care – PPO | Admitting: Podiatry

## 2020-09-26 ENCOUNTER — Other Ambulatory Visit: Payer: Self-pay | Admitting: Physician Assistant

## 2020-09-26 DIAGNOSIS — K219 Gastro-esophageal reflux disease without esophagitis: Secondary | ICD-10-CM | POA: Diagnosis not present

## 2020-09-26 DIAGNOSIS — G44229 Chronic tension-type headache, not intractable: Secondary | ICD-10-CM

## 2020-09-26 DIAGNOSIS — H903 Sensorineural hearing loss, bilateral: Secondary | ICD-10-CM | POA: Diagnosis not present

## 2020-10-12 ENCOUNTER — Other Ambulatory Visit (HOSPITAL_COMMUNITY): Payer: Self-pay | Admitting: Physician Assistant

## 2020-10-12 DIAGNOSIS — G44229 Chronic tension-type headache, not intractable: Secondary | ICD-10-CM

## 2020-10-12 DIAGNOSIS — H903 Sensorineural hearing loss, bilateral: Secondary | ICD-10-CM

## 2020-11-08 ENCOUNTER — Ambulatory Visit (HOSPITAL_COMMUNITY): Payer: BC Managed Care – PPO

## 2020-11-10 ENCOUNTER — Ambulatory Visit (HOSPITAL_COMMUNITY): Admission: RE | Admit: 2020-11-10 | Payer: BC Managed Care – PPO | Source: Ambulatory Visit

## 2020-11-19 ENCOUNTER — Ambulatory Visit (HOSPITAL_COMMUNITY): Admission: RE | Admit: 2020-11-19 | Payer: BC Managed Care – PPO | Source: Ambulatory Visit

## 2020-11-21 NOTE — Progress Notes (Addendum)
Spoke with pt's significant other, Bobbye Riggs to see if they were rescheduling his MRI for tomorrow. She states yes, that she left a message with MRI scheduler on Thursday stating that they needed to reschedule. The H&P had run out of date.  I left a message for Sheralyn Boatman in MRI to have her cancel the MRI for tomorrow and I called the OR desk and spoke with Tish to cancel also.

## 2020-11-22 ENCOUNTER — Encounter (HOSPITAL_COMMUNITY): Payer: Self-pay

## 2020-11-22 ENCOUNTER — Ambulatory Visit (HOSPITAL_COMMUNITY): Admission: RE | Admit: 2020-11-22 | Payer: BC Managed Care – PPO | Source: Ambulatory Visit

## 2020-11-22 ENCOUNTER — Encounter (HOSPITAL_COMMUNITY): Admission: RE | Payer: Self-pay | Source: Home / Self Care

## 2020-11-22 ENCOUNTER — Ambulatory Visit (HOSPITAL_COMMUNITY): Admission: RE | Admit: 2020-11-22 | Payer: BC Managed Care – PPO | Source: Home / Self Care | Admitting: Family Medicine

## 2020-11-22 SURGERY — MRI WITH ANESTHESIA
Anesthesia: General

## 2020-11-28 DIAGNOSIS — F411 Generalized anxiety disorder: Secondary | ICD-10-CM | POA: Diagnosis not present

## 2020-11-28 DIAGNOSIS — G4700 Insomnia, unspecified: Secondary | ICD-10-CM | POA: Diagnosis not present

## 2020-11-28 DIAGNOSIS — I10 Essential (primary) hypertension: Secondary | ICD-10-CM | POA: Diagnosis not present

## 2020-11-28 DIAGNOSIS — F331 Major depressive disorder, recurrent, moderate: Secondary | ICD-10-CM | POA: Diagnosis not present

## 2020-12-19 DIAGNOSIS — G4700 Insomnia, unspecified: Secondary | ICD-10-CM | POA: Diagnosis not present

## 2020-12-19 DIAGNOSIS — F331 Major depressive disorder, recurrent, moderate: Secondary | ICD-10-CM | POA: Diagnosis not present

## 2020-12-19 DIAGNOSIS — F411 Generalized anxiety disorder: Secondary | ICD-10-CM | POA: Diagnosis not present

## 2020-12-19 DIAGNOSIS — I10 Essential (primary) hypertension: Secondary | ICD-10-CM | POA: Diagnosis not present

## 2021-01-10 DIAGNOSIS — Z20822 Contact with and (suspected) exposure to covid-19: Secondary | ICD-10-CM | POA: Diagnosis not present

## 2021-01-19 DIAGNOSIS — J309 Allergic rhinitis, unspecified: Secondary | ICD-10-CM | POA: Diagnosis not present

## 2021-01-19 DIAGNOSIS — J029 Acute pharyngitis, unspecified: Secondary | ICD-10-CM | POA: Diagnosis not present

## 2021-01-19 DIAGNOSIS — K219 Gastro-esophageal reflux disease without esophagitis: Secondary | ICD-10-CM | POA: Diagnosis not present

## 2021-01-28 ENCOUNTER — Emergency Department (HOSPITAL_COMMUNITY): Payer: BC Managed Care – PPO

## 2021-01-28 ENCOUNTER — Encounter (HOSPITAL_COMMUNITY): Payer: Self-pay

## 2021-01-28 ENCOUNTER — Emergency Department (HOSPITAL_COMMUNITY)
Admission: EM | Admit: 2021-01-28 | Discharge: 2021-01-28 | Disposition: A | Discharge status: Home / Self Care | Payer: BC Managed Care – PPO | Attending: Emergency Medicine | Admitting: Emergency Medicine

## 2021-01-28 ENCOUNTER — Other Ambulatory Visit: Payer: Self-pay

## 2021-01-28 DIAGNOSIS — S0990XA Unspecified injury of head, initial encounter: Secondary | ICD-10-CM

## 2021-01-28 DIAGNOSIS — S0101XA Laceration without foreign body of scalp, initial encounter: Secondary | ICD-10-CM | POA: Diagnosis not present

## 2021-01-28 DIAGNOSIS — R5381 Other malaise: Secondary | ICD-10-CM | POA: Diagnosis not present

## 2021-01-28 DIAGNOSIS — Y9259 Other trade areas as the place of occurrence of the external cause: Secondary | ICD-10-CM | POA: Insufficient documentation

## 2021-01-28 DIAGNOSIS — M7981 Nontraumatic hematoma of soft tissue: Secondary | ICD-10-CM | POA: Diagnosis not present

## 2021-01-28 DIAGNOSIS — F10929 Alcohol use, unspecified with intoxication, unspecified: Secondary | ICD-10-CM | POA: Insufficient documentation

## 2021-01-28 DIAGNOSIS — W01198A Fall on same level from slipping, tripping and stumbling with subsequent striking against other object, initial encounter: Secondary | ICD-10-CM | POA: Insufficient documentation

## 2021-01-28 DIAGNOSIS — Z79899 Other long term (current) drug therapy: Secondary | ICD-10-CM | POA: Diagnosis not present

## 2021-01-28 DIAGNOSIS — W19XXXA Unspecified fall, initial encounter: Secondary | ICD-10-CM | POA: Diagnosis not present

## 2021-01-28 DIAGNOSIS — R220 Localized swelling, mass and lump, head: Secondary | ICD-10-CM | POA: Diagnosis not present

## 2021-01-28 DIAGNOSIS — Y9389 Activity, other specified: Secondary | ICD-10-CM | POA: Diagnosis not present

## 2021-01-28 DIAGNOSIS — I10 Essential (primary) hypertension: Secondary | ICD-10-CM | POA: Diagnosis not present

## 2021-01-28 DIAGNOSIS — F10920 Alcohol use, unspecified with intoxication, uncomplicated: Secondary | ICD-10-CM

## 2021-01-28 DIAGNOSIS — Z87891 Personal history of nicotine dependence: Secondary | ICD-10-CM | POA: Insufficient documentation

## 2021-01-28 DIAGNOSIS — F10120 Alcohol abuse with intoxication, uncomplicated: Secondary | ICD-10-CM | POA: Diagnosis not present

## 2021-01-28 DIAGNOSIS — R531 Weakness: Secondary | ICD-10-CM | POA: Diagnosis not present

## 2021-01-28 LAB — CBC WITH DIFFERENTIAL/PLATELET
Abs Immature Granulocytes: 0.04 10*3/uL (ref 0.00–0.07)
Basophils Absolute: 0.1 10*3/uL (ref 0.0–0.1)
Basophils Relative: 1 %
Eosinophils Absolute: 0.2 10*3/uL (ref 0.0–0.5)
Eosinophils Relative: 3 %
HCT: 37.4 % — ABNORMAL LOW (ref 39.0–52.0)
Hemoglobin: 12.4 g/dL — ABNORMAL LOW (ref 13.0–17.0)
Immature Granulocytes: 1 %
Lymphocytes Relative: 30 %
Lymphs Abs: 1.8 10*3/uL (ref 0.7–4.0)
MCH: 34.7 pg — ABNORMAL HIGH (ref 26.0–34.0)
MCHC: 33.2 g/dL (ref 30.0–36.0)
MCV: 104.8 fL — ABNORMAL HIGH (ref 80.0–100.0)
Monocytes Absolute: 0.7 10*3/uL (ref 0.1–1.0)
Monocytes Relative: 11 %
Neutro Abs: 3.1 10*3/uL (ref 1.7–7.7)
Neutrophils Relative %: 54 %
Platelets: 178 10*3/uL (ref 150–400)
RBC: 3.57 MIL/uL — ABNORMAL LOW (ref 4.22–5.81)
RDW: 12.7 % (ref 11.5–15.5)
WBC: 5.8 10*3/uL (ref 4.0–10.5)
nRBC: 0 % (ref 0.0–0.2)

## 2021-01-28 LAB — BASIC METABOLIC PANEL
Anion gap: 12 (ref 5–15)
BUN: 18 mg/dL (ref 6–20)
CO2: 21 mmol/L — ABNORMAL LOW (ref 22–32)
Calcium: 8.6 mg/dL — ABNORMAL LOW (ref 8.9–10.3)
Chloride: 103 mmol/L (ref 98–111)
Creatinine, Ser: 1.38 mg/dL — ABNORMAL HIGH (ref 0.61–1.24)
GFR, Estimated: >60 mL/min (ref >60)
Glucose, Bld: 111 mg/dL — ABNORMAL HIGH (ref 70–99)
Potassium: 3.7 mmol/L (ref 3.5–5.1)
Sodium: 136 mmol/L (ref 135–145)

## 2021-01-28 MED ORDER — SODIUM CHLORIDE 0.9 % IV BOLUS
1000.0000 mL | Freq: Once | INTRAVENOUS | Status: AC
  Administered 2021-01-28: 1000 mL via INTRAVENOUS

## 2021-01-28 MED ORDER — ACETAMINOPHEN 325 MG PO TABS
650.0000 mg | ORAL_TABLET | Freq: Once | ORAL | Status: AC
  Administered 2021-01-28: 650 mg via ORAL
  Filled 2021-01-28: qty 2

## 2021-01-28 MED ORDER — LIDOCAINE HCL (PF) 1 % IJ SOLN
5.0000 mL | Freq: Once | INTRAMUSCULAR | Status: AC
  Administered 2021-01-28: 5 mL
  Filled 2021-01-28: qty 5

## 2021-01-28 NOTE — ED Provider Notes (Addendum)
[facility] EMERGENCY DEPARTMENT Provider Note   CSN: [id number] Arrival date & time: 01/28/21  0124     History Chief Complaint  Patient presents with  . Fall  . Laceration    Sean Torres is a 45 y.o. male.  Patient presents ER chief complaint of head injury.  Reportedly at a bar states he had several alcoholic drinks, as he was sitting at he states he slipped and fell backwards and hit his head.  Complaining of a headache.  Otherwise denies any other extremity pain.  Denies fevers cough vomiting or diarrhea.        Past Medical History:  Diagnosis Date  . Allergy    seasonal  . Hypertension     Patient Active Problem List   Diagnosis Date Noted  . Alcohol use disorder, severe, dependence (HCC) 08/09/2019  . Class 1 obesity in adult 08/09/2019  . DOE (dyspnea on exertion) 08/09/2019  . Hypertension 08/09/2019  . OSA (obstructive sleep apnea) 08/09/2019  . Transaminitis 08/09/2019  . Sudden-onset sensorineural hearing loss 07/23/2019  . Tinnitus aurium, left 07/23/2019  . Mild concussion 06/14/2017  . Avitaminosis D 08/08/2015  . Acid reflux 08/02/2015    Past Surgical History:  Procedure Laterality Date  . COLONOSCOPY  06/2018  . LUMBAR DISC SURGERY         Family History  Problem Relation Age of Onset  . Diabetes Mother   . Diabetes Father   . Heart disease Father   . Diabetes Maternal Grandmother   . Diabetes Maternal Grandfather   . Heart attack Maternal Grandfather   . Diabetes Paternal Grandmother   . Throat cancer Paternal Grandmother   . Diabetes Paternal Grandfather   . Heart attack Paternal Grandfather   . Colon cancer Paternal Grandfather   . Colon polyps Paternal Grandfather   . Rectal cancer Neg Hx   . Stomach cancer Neg Hx     Social History   Tobacco Use  . Smoking status: Former Smoker    Years: 4.00  . Smokeless tobacco: Never Used  . Tobacco comment: Quit 2004   Vaping Use  . Vaping Use: Never used   Substance Use Topics  . Alcohol use: Yes    Alcohol/week: 3.0 standard drinks    Types: 3 Cans of beer per week  . Drug use: No    Home Medications Prior to Admission medications   Medication Sig Start Date End Date Taking? Authorizing Provider  olmesartan-hydrochlorothiazide (BENICAR HCT) 20-12.5 MG tablet Take 1 tablet by mouth daily.    [provider]  pantoprazole (PROTONIX) 40 MG tablet TAKE 1 TABLET(40 MG) BY MOUTH TWICE DAILY BEFORE A MEAL Patient taking differently: Take 40 mg by mouth 2 (two) times daily before a meal. 11/17/19   Iva Boop, MD    Allergies    Erythromycin base  Review of Systems   Review of Systems  Constitutional: Negative for fever.  HENT: Negative for ear pain and sore throat.   Eyes: Negative for pain.  Respiratory: Negative for cough.   Cardiovascular: Negative for chest pain.  Gastrointestinal: Negative for abdominal pain.  Genitourinary: Negative for flank pain.  Musculoskeletal: Negative for back pain.  Skin: Negative for color change and rash.  Neurological: Positive for headaches.  All other systems reviewed and are negative.   Physical Exam Updated Vital Signs BP 95/65   Pulse 84   Temp 97.6 F (36.4 C) (Oral)   Resp 14   Ht 6' (1.829  m)   Wt 102.1 kg   SpO2 95%   BMI 30.52 kg/m   Physical Exam Constitutional:      General: He is not in acute distress.    Appearance: He is well-developed.  HENT:     Head: Normocephalic.     Comments: 3 cm curvilinear laceration on the occipital region of the scalp.    Nose: Nose normal.  Eyes:     Extraocular Movements: Extraocular movements intact.  Cardiovascular:     Rate and Rhythm: Normal rate.  Pulmonary:     Effort: Pulmonary effort is normal.  Musculoskeletal:     Comments: No C or T or L-spine step-offs or tenderness noted.  Skin:    Coloration: Skin is not jaundiced.  Neurological:     Mental Status: He is alert. Mental status is at baseline.     ED  Results / Procedures / Treatments   Labs (all labs ordered are listed, but only abnormal results are displayed) Labs Reviewed  CBC WITH DIFFERENTIAL/PLATELET - Abnormal; Notable for the following components:      Result Value   RBC 3.57 (*)    Hemoglobin 12.4 (*)    HCT 37.4 (*)    MCV 104.8 (*)    MCH 34.7 (*)    All other components within normal limits  BASIC METABOLIC PANEL - Abnormal; Notable for the following components:   CO2 21 (*)    Glucose, Bld 111 (*)    Creatinine, Ser 1.38 (*)    Calcium 8.6 (*)    All other components within normal limits    EKG None  Radiology CT Head Wo Contrast  Result Date: 01/28/2021 CLINICAL DATA:  Fall while intoxicated EXAM: CT HEAD WITHOUT CONTRAST CT CERVICAL SPINE WITHOUT CONTRAST TECHNIQUE: Multidetector CT imaging of the head and cervical spine was performed following the standard protocol without intravenous contrast. Multiplanar CT image reconstructions of the cervical spine were also generated. COMPARISON:  CT June 24, 2017 and MRI brain June 17, 2018. FINDINGS: CT HEAD FINDINGS Brain: No evidence of acute infarction, hemorrhage, hydrocephalus, extra-axial collection or mass lesion/mass effect. Vascular: No hyperdense vessel or unexpected calcification. Skull: Normal. Negative for fracture or focal lesion. Sinuses/Orbits: Perineal sinuses and mastoid air cells are predominantly clear. Orbits are grossly unremarkable. Other: Small extra calvarial hematoma and soft tissue swelling overlying the right occiput. CT CERVICAL SPINE FINDINGS Alignment: Straightening of the normal cervical lordosis, commonly positional. No traumatic listhesis. Skull base and vertebrae: No acute fracture. No primary bone lesion or focal pathologic process. Mild lower cervical facet hypertrophy. Soft tissues and spinal canal: No prevertebral fluid or swelling. No visible canal hematoma. Mild soft tissue stranding in the posterior cervical subcutaneous soft tissues.  Disc levels:  Mild lower cervical disc space narrowing. Upper chest: No acute abnormality Other: Ossifications in the nuchal ligament. IMPRESSION: 1. No acute intracranial abnormality. 2. Small extra calvarial hematoma and soft tissue swelling overlying the right occiput. 3. No acute fracture or traumatic listhesis of the cervical spine. 4. Mild soft tissue stranding in the posterior cervical subcutaneous soft tissues. Electronically Signed   By: Maudry MayhewJeffrey  Waltz MD   On: 01/28/2021 03:19   CT Cervical Spine Wo Contrast  Result Date: 01/28/2021 CLINICAL DATA:  Fall while intoxicated EXAM: CT HEAD WITHOUT CONTRAST CT CERVICAL SPINE WITHOUT CONTRAST TECHNIQUE: Multidetector CT imaging of the head and cervical spine was performed following the standard protocol without intravenous contrast. Multiplanar CT image reconstructions of the cervical spine were also  generated. COMPARISON:  CT June 24, 2017 and MRI brain June 17, 2018. FINDINGS: CT HEAD FINDINGS Brain: No evidence of acute infarction, hemorrhage, hydrocephalus, extra-axial collection or mass lesion/mass effect. Vascular: No hyperdense vessel or unexpected calcification. Skull: Normal. Negative for fracture or focal lesion. Sinuses/Orbits: Perineal sinuses and mastoid air cells are predominantly clear. Orbits are grossly unremarkable. Other: Small extra calvarial hematoma and soft tissue swelling overlying the right occiput. CT CERVICAL SPINE FINDINGS Alignment: Straightening of the normal cervical lordosis, commonly positional. No traumatic listhesis. Skull base and vertebrae: No acute fracture. No primary bone lesion or focal pathologic process. Mild lower cervical facet hypertrophy. Soft tissues and spinal canal: No prevertebral fluid or swelling. No visible canal hematoma. Mild soft tissue stranding in the posterior cervical subcutaneous soft tissues. Disc levels:  Mild lower cervical disc space narrowing. Upper chest: No acute abnormality Other:  Ossifications in the nuchal ligament. IMPRESSION: 1. No acute intracranial abnormality. 2. Small extra calvarial hematoma and soft tissue swelling overlying the right occiput. 3. No acute fracture or traumatic listhesis of the cervical spine. 4. Mild soft tissue stranding in the posterior cervical subcutaneous soft tissues. Electronically Signed   By: Maudry Mayhew MD   On: 01/28/2021 03:19    Procedures .Critical Care E&M Performed by: Cheryll Cockayne, MD  Critical care provider statement:    Critical care time (minutes):  30   Critical care time was exclusive of:  Separately billable procedures and treating other patients   Critical care was necessary to treat or prevent imminent or life-threatening deterioration of the following conditions:  Circulatory failure After initial E/M assessment, critical care services were subsequently performed that were exclusive of separately billable procedures or treatment.   Comments:     Patient had episode of hypotension, resolved with IV fluid resuscitation.  Marland Kitchen.Laceration Repair  Date/Time: 01/28/2021 6:25 AM Performed by: Cheryll Cockayne, MD Authorized by: Cheryll Cockayne, MD   Comments:     3 cm curvilinear laceration of the occipital region of the scalp.  This was washed with sterile saline and water and irrigation.  Local lidocaine 1% no epinephrine still total of 2 cc.  Wound edges approximated with 3 staples which is patient tolerated well.  Advised return in 7 to 10 days for staple removal.     Medications Ordered in ED Medications  acetaminophen (TYLENOL) tablet 650 mg (has no administration in time range)  lidocaine (PF) (XYLOCAINE) 1 % injection 5 mL (5 mLs Infiltration Given 01/28/21 0239)  sodium chloride 0.9 % bolus 1,000 mL (0 mLs Intravenous Stopped 01/28/21 0536)    ED Course  I have reviewed the triage vital signs and the nursing notes.  Pertinent labs & imaging results that were available during my care of the patient were  reviewed by me and considered in my medical decision making (see chart for details).    MDM Rules/Calculators/A&P                          CT imaging is unremarkable for acute pathology intracranially.  Tetanus updated here in the ER.  Patient had episode of hypotension etiology unclear, labs are sent and IV fluid provided.  Subsequently blood pressure appears improved.  Patient is awake and alert cooperative at this time.  Appears a sustained a head injury due to alcohol intoxication now sobering up.  Will be discharged home in stable condition.  Advised return in 7 to 10 days for staple  removal.  Otherwise advised him to follow-up with her primary care doctor in 2 or 3 days.  His emergency contact is here and will transport him home.  Final Clinical Impression(s) / ED Diagnoses Final diagnoses:  Injury of head, initial encounter  Alcoholic intoxication without complication [facility])    Rx / DC Orders ED Discharge Orders    None       Cheryll Cockayne, MD 01/28/21 0488    Cheryll Cockayne, MD 01/28/21 [phone]

## 2021-01-28 NOTE — ED Notes (Signed)
Patient verbalizes understanding of discharge instructions. Opportunity for questioning and answers were provided. Armband removed by staff, pt discharged from ED.  

## 2021-01-28 NOTE — ED Provider Notes (Signed)
Patient very functional.  Patient urinating with good dilute urine.  Blood pressures have still been on the soft side but we had the last 2:02 liters of fluid has been systolic 100 and then systolic of 98.  Ambulated patient.  He did very well no symptoms.  Patient stable for discharge home.   Vanetta Mulders, MD 01/28/21 [phone]

## 2021-01-28 NOTE — ED Triage Notes (Signed)
Pt BIB EMS from a tequila bar on [address]. Pt was leaving bar and fell, hitting his head. Admits to ETOH intoxication, but unsure of how much he drank. No LOC. Pt arrives to ED A&O x3, disoriented to situation.   EMS vitals: 140/90 Pulse 90 CBG 159 97.4 oral  RR WDL

## 2021-01-28 NOTE — ED Notes (Signed)
Provider at bedside

## 2021-01-28 NOTE — ED Notes (Signed)
Returned from radiology at this time 

## 2021-01-28 NOTE — Discharge Instructions (Addendum)
Call your primary care doctor or specialist as discussed in the next 2-3 days.   Return immediately back to the ER if:  Your symptoms worsen within the next 12-24 hours. You develop new symptoms such as new fevers, persistent vomiting, new pain, shortness of breath, or new weakness or numbness, or if you have any other concerns.  Return in 7-10 days to remove the staples.

## 2021-01-28 NOTE — ED Notes (Signed)
Suture cart at bedside 

## 2021-01-28 NOTE — ED Notes (Signed)
Report given to [place] at this time 

## 2021-01-28 NOTE — ED Notes (Signed)
Discussed with provider about pt current vs. Received orders for more fluids at this time

## 2021-01-28 NOTE — ED Notes (Signed)
Provider at bedside at this time

## 2021-01-28 NOTE — ED Notes (Signed)
Patient transported to CT 

## 2021-02-20 DIAGNOSIS — K219 Gastro-esophageal reflux disease without esophagitis: Secondary | ICD-10-CM | POA: Diagnosis not present

## 2021-02-20 DIAGNOSIS — J3489 Other specified disorders of nose and nasal sinuses: Secondary | ICD-10-CM | POA: Diagnosis not present

## 2021-02-20 DIAGNOSIS — H903 Sensorineural hearing loss, bilateral: Secondary | ICD-10-CM | POA: Diagnosis not present

## 2021-02-21 ENCOUNTER — Other Ambulatory Visit: Payer: Self-pay | Admitting: Physician Assistant

## 2021-02-21 DIAGNOSIS — H903 Sensorineural hearing loss, bilateral: Secondary | ICD-10-CM

## 2021-02-23 ENCOUNTER — Other Ambulatory Visit (HOSPITAL_COMMUNITY): Payer: Self-pay | Admitting: Physician Assistant

## 2021-02-23 DIAGNOSIS — H903 Sensorineural hearing loss, bilateral: Secondary | ICD-10-CM

## 2021-03-03 DIAGNOSIS — J309 Allergic rhinitis, unspecified: Secondary | ICD-10-CM | POA: Diagnosis not present

## 2021-03-03 DIAGNOSIS — K219 Gastro-esophageal reflux disease without esophagitis: Secondary | ICD-10-CM | POA: Diagnosis not present

## 2021-03-03 DIAGNOSIS — J321 Chronic frontal sinusitis: Secondary | ICD-10-CM | POA: Diagnosis not present

## 2021-03-10 ENCOUNTER — Ambulatory Visit (HOSPITAL_COMMUNITY)
Admission: RE | Admit: 2021-03-10 | Discharge: 2021-03-10 | Disposition: A | Discharge status: Home / Self Care | Payer: BC Managed Care – PPO | Source: Ambulatory Visit | Attending: Family Medicine | Admitting: Family Medicine

## 2021-03-10 DIAGNOSIS — Z01812 Encounter for preprocedural laboratory examination: Secondary | ICD-10-CM | POA: Insufficient documentation

## 2021-03-10 DIAGNOSIS — Z20822 Contact with and (suspected) exposure to covid-19: Secondary | ICD-10-CM | POA: Insufficient documentation

## 2021-03-10 LAB — SARS CORONAVIRUS 2 (TAT 6-24 HRS): SARS Coronavirus 2: NEGATIVE

## 2021-03-13 ENCOUNTER — Other Ambulatory Visit: Payer: Self-pay

## 2021-03-13 ENCOUNTER — Encounter (HOSPITAL_COMMUNITY): Payer: Self-pay

## 2021-03-13 NOTE — Progress Notes (Signed)
PCP - Dr. Duanne Guess  Cardiologist -   Chest x-ray -  EKG - DOS Stress Test -  ECHO -  Cardiac Cath -   Pt has been diagnosed with SA but does not wear CPAP or O2 at home   COVID TEST- 03/10/21 negative   Anesthesia review: n/a  -------------  SDW INSTRUCTIONS:  Your procedure is scheduled on 03/14/21. Please report to [facility] Main Entrance "A" at 0730 A.M., and check in at the Admitting office. Call this number if you have problems the morning of surgery: [phone]   Remember: Do not eat or drink after midnight the night before your surgery   Medications to take morning of surgery with a sip of water include: amoxicillin-clavulanate (AUGMENTIN) esomeprazole (NEXIUM)  sertraline (ZOLOFT)  As of today, STOP taking any Aspirin (unless otherwise instructed by your surgeon), Aleve, Naproxen, Ibuprofen, Motrin, Advil, Goody's, BC's, all herbal medications, fish oil, and all vitamins.    The Morning of Surgery Do not wear jewelry Do not wear lotions, powders, colognes, or deodorant Men may shave face and neck. Do not bring valuables to the hospital. [facility] is not responsible for any belongings or valuables. If you are a smoker, DO NOT Smoke 24 hours prior to surgery If you wear a CPAP at night please bring your mask the morning of surgery  Remember that you must have someone to transport you home after your surgery, and remain with you for 24 hours if you are discharged the same day. Please bring cases for contacts, glasses, hearing aids, dentures or bridgework because it cannot be worn into surgery.   Patients discharged the day of surgery will not be allowed to drive home.   Please shower the NIGHT BEFORE SURGERY and the MORNING OF SURGERY with DIAL Soap. Wear comfortable clothes the morning of surgery. Oral Hygiene is also important to reduce your risk of infection.  Remember - BRUSH YOUR TEETH THE MORNING OF SURGERY WITH YOUR REGULAR TOOTHPASTE  Patient denies  shortness of breath, fever, cough and chest pain.

## 2021-03-14 ENCOUNTER — Ambulatory Visit (HOSPITAL_COMMUNITY): Payer: BC Managed Care – PPO | Admitting: Anesthesiology

## 2021-03-14 ENCOUNTER — Encounter (HOSPITAL_COMMUNITY)
Admission: RE | Disposition: A | Discharge status: Home / Self Care | Payer: Self-pay | Source: Home / Self Care | Attending: Physician Assistant

## 2021-03-14 ENCOUNTER — Ambulatory Visit (HOSPITAL_COMMUNITY)
Admission: RE | Admit: 2021-03-14 | Discharge: 2021-03-14 | Disposition: A | Discharge status: Home / Self Care | Payer: BC Managed Care – PPO | Source: Ambulatory Visit | Attending: Physician Assistant | Admitting: Physician Assistant

## 2021-03-14 ENCOUNTER — Ambulatory Visit (HOSPITAL_COMMUNITY)
Admission: RE | Admit: 2021-03-14 | Discharge: 2021-03-14 | Disposition: A | Discharge status: Home / Self Care | Payer: BC Managed Care – PPO | Attending: Physician Assistant | Admitting: Physician Assistant

## 2021-03-14 ENCOUNTER — Encounter (HOSPITAL_COMMUNITY): Payer: Self-pay

## 2021-03-14 ENCOUNTER — Other Ambulatory Visit: Payer: Self-pay

## 2021-03-14 DIAGNOSIS — H9190 Unspecified hearing loss, unspecified ear: Secondary | ICD-10-CM | POA: Diagnosis not present

## 2021-03-14 DIAGNOSIS — E559 Vitamin D deficiency, unspecified: Secondary | ICD-10-CM | POA: Diagnosis not present

## 2021-03-14 DIAGNOSIS — I10 Essential (primary) hypertension: Secondary | ICD-10-CM | POA: Diagnosis not present

## 2021-03-14 DIAGNOSIS — R490 Dysphonia: Secondary | ICD-10-CM | POA: Diagnosis not present

## 2021-03-14 DIAGNOSIS — H905 Unspecified sensorineural hearing loss: Secondary | ICD-10-CM | POA: Diagnosis not present

## 2021-03-14 DIAGNOSIS — Z79899 Other long term (current) drug therapy: Secondary | ICD-10-CM | POA: Diagnosis not present

## 2021-03-14 DIAGNOSIS — J3489 Other specified disorders of nose and nasal sinuses: Secondary | ICD-10-CM | POA: Diagnosis not present

## 2021-03-14 DIAGNOSIS — H903 Sensorineural hearing loss, bilateral: Secondary | ICD-10-CM

## 2021-03-14 DIAGNOSIS — G4733 Obstructive sleep apnea (adult) (pediatric): Secondary | ICD-10-CM | POA: Diagnosis not present

## 2021-03-14 HISTORY — PX: RADIOLOGY WITH ANESTHESIA: SHX6223

## 2021-03-14 HISTORY — DX: Sleep apnea, unspecified: G47.30

## 2021-03-14 LAB — COMPREHENSIVE METABOLIC PANEL
ALT: 87 U/L — ABNORMAL HIGH (ref 0–44)
AST: 72 U/L — ABNORMAL HIGH (ref 15–41)
Albumin: 4.7 g/dL (ref 3.5–5.0)
Alkaline Phosphatase: 53 U/L (ref 38–126)
Anion gap: 11 (ref 5–15)
BUN: 17 mg/dL (ref 6–20)
CO2: 22 mmol/L (ref 22–32)
Calcium: 9.2 mg/dL (ref 8.9–10.3)
Chloride: 104 mmol/L (ref 98–111)
Creatinine, Ser: 0.97 mg/dL (ref 0.61–1.24)
GFR, Estimated: >60 mL/min (ref >60)
Glucose, Bld: 94 mg/dL (ref 70–99)
Potassium: 4 mmol/L (ref 3.5–5.1)
Sodium: 137 mmol/L (ref 135–145)
Total Bilirubin: 0.8 mg/dL (ref 0.3–1.2)
Total Protein: 7.5 g/dL (ref 6.5–8.1)

## 2021-03-14 SURGERY — MRI WITH ANESTHESIA
Anesthesia: General

## 2021-03-14 MED ORDER — LACTATED RINGERS IV SOLN: INTRAVENOUS | Status: DC

## 2021-03-14 MED ORDER — SUGAMMADEX SODIUM 200 MG/2ML IV SOLN
INTRAVENOUS | Status: DC | PRN
  Administered 2021-03-14: 300 mg via INTRAVENOUS

## 2021-03-14 MED ORDER — PROPOFOL 10 MG/ML IV BOLUS
INTRAVENOUS | Status: DC | PRN
  Administered 2021-03-14: 150 mg via INTRAVENOUS
  Administered 2021-03-14: 50 mg via INTRAVENOUS
  Administered 2021-03-14: 30 mg via INTRAVENOUS
  Administered 2021-03-14: 20 mg via INTRAVENOUS

## 2021-03-14 MED ORDER — MIDAZOLAM HCL 2 MG/2ML IJ SOLN
INTRAMUSCULAR | Status: DC | PRN
  Administered 2021-03-14: 2 mg via INTRAVENOUS

## 2021-03-14 MED ORDER — ROCURONIUM BROMIDE 10 MG/ML (PF) SYRINGE
PREFILLED_SYRINGE | INTRAVENOUS | Status: DC | PRN
  Administered 2021-03-14: 60 mg via INTRAVENOUS
  Administered 2021-03-14: 10 mg via INTRAVENOUS
  Administered 2021-03-14: 20 mg via INTRAVENOUS
  Administered 2021-03-14: 10 mg via INTRAVENOUS

## 2021-03-14 MED ORDER — GADOBUTROL 1 MMOL/ML IV SOLN
10.0000 mL | Freq: Once | INTRAVENOUS | Status: AC | PRN
  Administered 2021-03-14: 10 mL via INTRAVENOUS

## 2021-03-14 MED ORDER — CHLORHEXIDINE GLUCONATE 0.12 % MT SOLN
15.0000 mL | Freq: Once | OROMUCOSAL | Status: AC
  Administered 2021-03-14: 15 mL via OROMUCOSAL
  Filled 2021-03-14: qty 15

## 2021-03-14 MED ORDER — DEXMEDETOMIDINE (PRECEDEX) IN NS 20 MCG/5ML (4 MCG/ML) IV SYRINGE
PREFILLED_SYRINGE | INTRAVENOUS | Status: DC | PRN
  Administered 2021-03-14: 20 ug via INTRAVENOUS

## 2021-03-14 MED ORDER — ORAL CARE MOUTH RINSE: 15.0000 mL | Freq: Once | OROMUCOSAL | Status: AC

## 2021-03-14 MED ORDER — LIDOCAINE 2% (20 MG/ML) 5 ML SYRINGE
INTRAMUSCULAR | Status: DC | PRN
  Administered 2021-03-14: 40 mg via INTRAVENOUS

## 2021-03-14 MED ORDER — DEXAMETHASONE SODIUM PHOSPHATE 10 MG/ML IJ SOLN
INTRAMUSCULAR | Status: DC | PRN
  Administered 2021-03-14: 5 mg via INTRAVENOUS

## 2021-03-14 MED ORDER — ONDANSETRON HCL 4 MG/2ML IJ SOLN
INTRAMUSCULAR | Status: DC | PRN
  Administered 2021-03-14: 4 mg via INTRAVENOUS

## 2021-03-14 NOTE — Anesthesia Preprocedure Evaluation (Addendum)
Anesthesia Evaluation  Patient identified by MRN, date of birth, ID band Patient awake    Reviewed: Allergy & Precautions, NPO status , Patient's Chart, lab work & pertinent test results  Airway Mallampati: III  TM Distance: >3 FB Neck ROM: Full    Dental no notable dental hx. (+) Teeth Intact, Dental Advisory Given   Pulmonary sleep apnea (does not use CPAP) , former smoker,    Pulmonary exam normal breath sounds clear to auscultation       Cardiovascular hypertension, Pt. on medications and Pt. on home beta blockers Normal cardiovascular exam Rhythm:Regular Rate:Normal     Neuro/Psych negative neurological ROS  negative psych ROS   GI/Hepatic Neg liver ROS, GERD  Medicated and Controlled,  Endo/Other  negative endocrine ROS  Renal/GU negative Renal ROS  negative genitourinary   Musculoskeletal negative musculoskeletal ROS (+)   Abdominal   Peds  Hematology negative hematology ROS (+)   Anesthesia Other Findings   Reproductive/Obstetrics                            Anesthesia Physical Anesthesia Plan  ASA: III  Anesthesia Plan: General   Post-op Pain Management:    Induction: Intravenous  PONV Risk Score and Plan: 2 and Midazolam, Dexamethasone and Ondansetron  Airway Management Planned: Oral ETT  Additional Equipment:   Intra-op Plan:   Post-operative Plan: Extubation in OR  Informed Consent: I have reviewed the patients History and Physical, chart, labs and discussed the procedure including the risks, benefits and alternatives for the proposed anesthesia with the patient or authorized representative who has indicated his/her understanding and acceptance.     Dental advisory given  Plan Discussed with: CRNA  Anesthesia Plan Comments:        Anesthesia Quick Evaluation

## 2021-03-14 NOTE — Transfer of Care (Signed)
Immediate Anesthesia Transfer of Care Note  Patient: Sean Torres  Procedure(s) Performed: MRI WITH ANESTHESIA  BRAIN TO INCLUDE IAC WITH AND WITHOUT CONTRAST (N/A )  Patient Location: PACU  Anesthesia Type:General  Level of Consciousness: awake, alert  and patient cooperative  Airway & Oxygen Therapy: Patient Spontanous Breathing  Post-op Assessment: Report given to RN and Post -op Vital signs reviewed and stable  Post vital signs: Reviewed and stable  Last Vitals:  Vitals Value Taken Time  BP 97/74 03/14/21 1051  Temp    Pulse 105 03/14/21 1051  Resp 12 03/14/21 1051  SpO2 90 % 03/14/21 1051    Last Pain:  Vitals:   03/14/21 0821  TempSrc:   PainSc: 0-No pain      Patients Stated Pain Goal: 4 (03/14/21 0821)  Complications: No complications documented.

## 2021-03-14 NOTE — Anesthesia Procedure Notes (Signed)
Procedure Name: Intubation Date/Time: 03/14/2021 9:38 AM Performed by: Janace Litten, CRNA Pre-anesthesia Checklist: Patient identified, Emergency Drugs available, Suction available and Patient being monitored Patient Re-evaluated:Patient Re-evaluated prior to induction Oxygen Delivery Method: Circle System Utilized Preoxygenation: Pre-oxygenation with 100% oxygen Induction Type: IV induction Ventilation: Mask ventilation without difficulty Laryngoscope Size: Mac and 4 Grade View: Grade II Tube type: Oral Tube size: 7.5 mm Number of attempts: 1 Airway Equipment and Method: Stylet and Oral airway Placement Confirmation: ETT inserted through vocal cords under direct vision,  positive ETCO2 and breath sounds checked- equal and bilateral Secured at: 22 cm Tube secured with: Tape Dental Injury: Teeth and Oropharynx as per pre-operative assessment

## 2021-03-14 NOTE — Anesthesia Postprocedure Evaluation (Signed)
Anesthesia Post Note  Patient: Sean Torres  Procedure(s) Performed: MRI WITH ANESTHESIA  BRAIN TO INCLUDE IAC WITH AND WITHOUT CONTRAST (N/A )     Patient location during evaluation: PACU Anesthesia Type: General Level of consciousness: awake and alert Pain management: pain level controlled Vital Signs Assessment: post-procedure vital signs reviewed and stable Respiratory status: spontaneous breathing, nonlabored ventilation, respiratory function stable and patient connected to nasal cannula oxygen Cardiovascular status: blood pressure returned to baseline and stable Postop Assessment: no apparent nausea or vomiting Anesthetic complications: no   No complications documented.  Last Vitals:  Vitals:   03/14/21 1110 03/14/21 1115  BP: (!) 101/51 (!) 94/59  Pulse: 100 92  Resp: 18 16  Temp:  36.5 C  SpO2: 93% 96%    Last Pain:  Vitals:   03/14/21 1115  TempSrc:   PainSc: 0-No pain                 Dinna Severs L Ashya Nicolaisen

## 2021-03-15 ENCOUNTER — Encounter (HOSPITAL_COMMUNITY): Payer: Self-pay | Admitting: Radiology

## 2021-03-20 DIAGNOSIS — R5383 Other fatigue: Secondary | ICD-10-CM | POA: Diagnosis not present

## 2021-03-20 DIAGNOSIS — K219 Gastro-esophageal reflux disease without esophagitis: Secondary | ICD-10-CM | POA: Diagnosis not present

## 2021-03-20 DIAGNOSIS — J309 Allergic rhinitis, unspecified: Secondary | ICD-10-CM | POA: Diagnosis not present

## 2021-03-20 DIAGNOSIS — U099 Post covid-19 condition, unspecified: Secondary | ICD-10-CM | POA: Diagnosis not present

## 2021-03-23 DIAGNOSIS — R490 Dysphonia: Secondary | ICD-10-CM | POA: Diagnosis not present

## 2021-03-23 DIAGNOSIS — R9431 Abnormal electrocardiogram [ECG] [EKG]: Secondary | ICD-10-CM | POA: Diagnosis not present

## 2021-03-23 DIAGNOSIS — R000 Tachycardia, unspecified: Secondary | ICD-10-CM | POA: Diagnosis not present

## 2021-03-23 DIAGNOSIS — R0789 Other chest pain: Secondary | ICD-10-CM | POA: Diagnosis not present

## 2021-03-23 DIAGNOSIS — I10 Essential (primary) hypertension: Secondary | ICD-10-CM | POA: Diagnosis not present

## 2021-03-23 DIAGNOSIS — R0602 Shortness of breath: Secondary | ICD-10-CM | POA: Diagnosis not present

## 2021-04-05 DIAGNOSIS — F411 Generalized anxiety disorder: Secondary | ICD-10-CM | POA: Diagnosis not present

## 2021-04-05 DIAGNOSIS — J385 Laryngeal spasm: Secondary | ICD-10-CM | POA: Diagnosis not present

## 2021-04-05 DIAGNOSIS — R5383 Other fatigue: Secondary | ICD-10-CM | POA: Diagnosis not present

## 2021-04-05 DIAGNOSIS — I10 Essential (primary) hypertension: Secondary | ICD-10-CM | POA: Diagnosis not present

## 2021-07-18 DIAGNOSIS — F411 Generalized anxiety disorder: Secondary | ICD-10-CM | POA: Diagnosis not present

## 2021-07-18 DIAGNOSIS — F331 Major depressive disorder, recurrent, moderate: Secondary | ICD-10-CM | POA: Diagnosis not present

## 2021-07-18 DIAGNOSIS — I10 Essential (primary) hypertension: Secondary | ICD-10-CM | POA: Diagnosis not present

## 2021-07-18 DIAGNOSIS — G4700 Insomnia, unspecified: Secondary | ICD-10-CM | POA: Diagnosis not present

## 2021-10-27 ENCOUNTER — Emergency Department (HOSPITAL_BASED_OUTPATIENT_CLINIC_OR_DEPARTMENT_OTHER): Payer: BC Managed Care – PPO

## 2021-10-27 ENCOUNTER — Encounter (HOSPITAL_BASED_OUTPATIENT_CLINIC_OR_DEPARTMENT_OTHER): Payer: Self-pay

## 2021-10-27 ENCOUNTER — Inpatient Hospital Stay (HOSPITAL_BASED_OUTPATIENT_CLINIC_OR_DEPARTMENT_OTHER)
Admission: EM | Admit: 2021-10-27 | Discharge: 2021-10-29 | DRG: 440 | Disposition: A | Discharge status: Home / Self Care | Payer: BC Managed Care – PPO | Attending: Family Medicine | Admitting: Family Medicine

## 2021-10-27 ENCOUNTER — Other Ambulatory Visit: Payer: Self-pay

## 2021-10-27 DIAGNOSIS — R000 Tachycardia, unspecified: Secondary | ICD-10-CM | POA: Diagnosis not present

## 2021-10-27 DIAGNOSIS — Z79899 Other long term (current) drug therapy: Secondary | ICD-10-CM

## 2021-10-27 DIAGNOSIS — K859 Acute pancreatitis without necrosis or infection, unspecified: Secondary | ICD-10-CM | POA: Diagnosis not present

## 2021-10-27 DIAGNOSIS — F32A Depression, unspecified: Secondary | ICD-10-CM | POA: Diagnosis not present

## 2021-10-27 DIAGNOSIS — I10 Essential (primary) hypertension: Secondary | ICD-10-CM | POA: Diagnosis not present

## 2021-10-27 DIAGNOSIS — K219 Gastro-esophageal reflux disease without esophagitis: Secondary | ICD-10-CM | POA: Diagnosis present

## 2021-10-27 DIAGNOSIS — Z881 Allergy status to other antibiotic agents status: Secondary | ICD-10-CM

## 2021-10-27 DIAGNOSIS — F1020 Alcohol dependence, uncomplicated: Secondary | ICD-10-CM | POA: Diagnosis not present

## 2021-10-27 DIAGNOSIS — E876 Hypokalemia: Secondary | ICD-10-CM | POA: Diagnosis not present

## 2021-10-27 DIAGNOSIS — Z8249 Family history of ischemic heart disease and other diseases of the circulatory system: Secondary | ICD-10-CM

## 2021-10-27 DIAGNOSIS — D696 Thrombocytopenia, unspecified: Secondary | ICD-10-CM | POA: Diagnosis not present

## 2021-10-27 DIAGNOSIS — G4733 Obstructive sleep apnea (adult) (pediatric): Secondary | ICD-10-CM | POA: Diagnosis present

## 2021-10-27 DIAGNOSIS — R748 Abnormal levels of other serum enzymes: Secondary | ICD-10-CM

## 2021-10-27 DIAGNOSIS — Z6829 Body mass index (BMI) 29.0-29.9, adult: Secondary | ICD-10-CM

## 2021-10-27 DIAGNOSIS — Z7141 Alcohol abuse counseling and surveillance of alcoholic: Secondary | ICD-10-CM | POA: Diagnosis not present

## 2021-10-27 DIAGNOSIS — K852 Alcohol induced acute pancreatitis without necrosis or infection: Principal | ICD-10-CM | POA: Diagnosis present

## 2021-10-27 DIAGNOSIS — K760 Fatty (change of) liver, not elsewhere classified: Secondary | ICD-10-CM | POA: Diagnosis present

## 2021-10-27 DIAGNOSIS — Z87891 Personal history of nicotine dependence: Secondary | ICD-10-CM | POA: Diagnosis not present

## 2021-10-27 DIAGNOSIS — E669 Obesity, unspecified: Secondary | ICD-10-CM | POA: Diagnosis present

## 2021-10-27 DIAGNOSIS — R112 Nausea with vomiting, unspecified: Secondary | ICD-10-CM | POA: Diagnosis not present

## 2021-10-27 DIAGNOSIS — R7401 Elevation of levels of liver transaminase levels: Secondary | ICD-10-CM | POA: Diagnosis not present

## 2021-10-27 DIAGNOSIS — Z20822 Contact with and (suspected) exposure to covid-19: Secondary | ICD-10-CM | POA: Diagnosis present

## 2021-10-27 LAB — CBC
HCT: 40.8 % (ref 39.0–52.0)
Hemoglobin: 14.1 g/dL (ref 13.0–17.0)
MCH: 33.7 pg (ref 26.0–34.0)
MCHC: 34.6 g/dL (ref 30.0–36.0)
MCV: 97.6 fL (ref 80.0–100.0)
Platelets: 124 10*3/uL — ABNORMAL LOW (ref 150–400)
RBC: 4.18 MIL/uL — ABNORMAL LOW (ref 4.22–5.81)
RDW: 12 % (ref 11.5–15.5)
WBC: 9.3 10*3/uL (ref 4.0–10.5)
nRBC: 0 % (ref 0.0–0.2)

## 2021-10-27 LAB — COMPREHENSIVE METABOLIC PANEL
ALT: 134 U/L — ABNORMAL HIGH (ref 0–44)
AST: 140 U/L — ABNORMAL HIGH (ref 15–41)
Albumin: 5 g/dL (ref 3.5–5.0)
Alkaline Phosphatase: 82 U/L (ref 38–126)
Anion gap: 23 — ABNORMAL HIGH (ref 5–15)
BUN: 19 mg/dL (ref 6–20)
CO2: 17 mmol/L — ABNORMAL LOW (ref 22–32)
Calcium: 8.6 mg/dL — ABNORMAL LOW (ref 8.9–10.3)
Chloride: 94 mmol/L — ABNORMAL LOW (ref 98–111)
Creatinine, Ser: 0.77 mg/dL (ref 0.61–1.24)
GFR, Estimated: >60 mL/min (ref >60)
Glucose, Bld: 129 mg/dL — ABNORMAL HIGH (ref 70–99)
Potassium: 3.1 mmol/L — ABNORMAL LOW (ref 3.5–5.1)
Sodium: 134 mmol/L — ABNORMAL LOW (ref 135–145)
Total Bilirubin: 1.7 mg/dL — ABNORMAL HIGH (ref 0.3–1.2)
Total Protein: 7.9 g/dL (ref 6.5–8.1)

## 2021-10-27 LAB — URINALYSIS, ROUTINE W REFLEX MICROSCOPIC
Bilirubin Urine: NEGATIVE
Glucose, UA: NEGATIVE mg/dL
Hgb urine dipstick: NEGATIVE
Ketones, ur: >80 mg/dL — AB
Leukocytes,Ua: NEGATIVE
Nitrite: NEGATIVE
Protein, ur: 30 mg/dL — AB
Specific Gravity, Urine: 1.046 — ABNORMAL HIGH (ref 1.005–1.030)
pH: 6 (ref 5.0–8.0)

## 2021-10-27 LAB — RESP PANEL BY RT-PCR (FLU A&B, COVID) ARPGX2
Influenza A by PCR: NEGATIVE
Influenza B by PCR: NEGATIVE
SARS Coronavirus 2 by RT PCR: NEGATIVE

## 2021-10-27 LAB — LIPASE, BLOOD: Lipase: 336 U/L — ABNORMAL HIGH (ref 11–51)

## 2021-10-27 MED ORDER — THIAMINE HCL 100 MG/ML IJ SOLN
100.0000 mg | Freq: Every day | INTRAMUSCULAR | Status: DC
  Administered 2021-10-27: 22:00:00 100 mg via INTRAVENOUS
  Filled 2021-10-27: qty 2

## 2021-10-27 MED ORDER — SODIUM CHLORIDE 0.9 % IV BOLUS
1000.0000 mL | Freq: Once | INTRAVENOUS | Status: AC
  Administered 2021-10-27: 20:00:00 1000 mL via INTRAVENOUS

## 2021-10-27 MED ORDER — LORAZEPAM 1 MG PO TABS: 0.0000 mg | ORAL_TABLET | Freq: Four times a day (QID) | ORAL | Status: DC

## 2021-10-27 MED ORDER — SODIUM CHLORIDE 0.9 % IV SOLN: INTRAVENOUS | Status: DC

## 2021-10-27 MED ORDER — ONDANSETRON 4 MG PO TBDP
ORAL_TABLET | ORAL | Status: AC
  Filled 2021-10-27: qty 1

## 2021-10-27 MED ORDER — ONDANSETRON 4 MG PO TBDP
4.0000 mg | ORAL_TABLET | Freq: Once | ORAL | Status: AC | PRN
  Administered 2021-10-27: 15:00:00 4 mg via ORAL

## 2021-10-27 MED ORDER — LORAZEPAM 2 MG/ML IJ SOLN: 0.0000 mg | Freq: Four times a day (QID) | INTRAMUSCULAR | Status: DC

## 2021-10-27 MED ORDER — LORAZEPAM 2 MG/ML IJ SOLN: 0.0000 mg | Freq: Two times a day (BID) | INTRAMUSCULAR | Status: DC

## 2021-10-27 MED ORDER — LORAZEPAM 2 MG/ML IJ SOLN
1.0000 mg | Freq: Once | INTRAMUSCULAR | Status: AC
  Administered 2021-10-27: 20:00:00 1 mg via INTRAVENOUS
  Filled 2021-10-27: qty 1

## 2021-10-27 MED ORDER — SODIUM CHLORIDE 0.9 % IV BOLUS
1000.0000 mL | Freq: Once | INTRAVENOUS | Status: AC
  Administered 2021-10-27: 17:00:00 1000 mL via INTRAVENOUS

## 2021-10-27 MED ORDER — ONDANSETRON HCL 4 MG/2ML IJ SOLN
4.0000 mg | Freq: Once | INTRAMUSCULAR | Status: AC
  Administered 2021-10-27: 17:00:00 4 mg via INTRAVENOUS
  Filled 2021-10-27: qty 2

## 2021-10-27 MED ORDER — ONDANSETRON HCL 4 MG/2ML IJ SOLN
4.0000 mg | Freq: Once | INTRAMUSCULAR | Status: AC
  Administered 2021-10-27: 19:00:00 4 mg via INTRAVENOUS
  Filled 2021-10-27: qty 2

## 2021-10-27 MED ORDER — LORAZEPAM 1 MG PO TABS: 0.0000 mg | ORAL_TABLET | Freq: Two times a day (BID) | ORAL | Status: DC

## 2021-10-27 MED ORDER — IOHEXOL 300 MG/ML  SOLN
100.0000 mL | Freq: Once | INTRAMUSCULAR | Status: AC | PRN
  Administered 2021-10-27: 18:00:00 100 mL via INTRAVENOUS

## 2021-10-27 MED ORDER — THIAMINE HCL 100 MG PO TABS
100.0000 mg | ORAL_TABLET | Freq: Every day | ORAL | Status: DC
  Administered 2021-10-28 – 2021-10-29 (×2): 100 mg via ORAL
  Filled 2021-10-27 (×3): qty 1

## 2021-10-27 NOTE — Assessment & Plan Note (Addendum)
PPI ?

## 2021-10-27 NOTE — Progress Notes (Signed)
Pt has arrived to room 1502 Alert, straight to bathroom.

## 2021-10-27 NOTE — Assessment & Plan Note (Addendum)
Suspect etoh pancreatitis based on hx CT abd/pelvis with severe steatosis, subacute or early pancreatitis Labs and imaging c/w pancreatitis, but unusual in that he denies every any pain (only inability to tolerate PO).  Ddx includes etoh gastritis, etc. RUQ [place] negative for gallstones Advance diet as tolerated - tolerating Sean Torres soft diet today --- discussed importance of etoh cessation, consider GI follow up if persistent symptoms.

## 2021-10-27 NOTE — ED Provider Notes (Signed)
[facility] EMERGENCY DEPT Provider Note   CSN: [id number] Arrival date & time: 10/27/21  1509     History Chief Complaint  Patient presents with   Emesis   Diarrhea    Sean Torres is a 45 y.o. male.  Patient became ill on Sunday.  Has had nausea vomiting and some diarrhea since that time.  Denies any abdominal pain.  But has not been able to eat or drink very well.  Denies any fever.  Patient also states he has had a cough during that time.  Patient's had a past history of alcohol use.      Past Medical History:  Diagnosis Date   Allergy    seasonal   Hypertension    Sleep apnea     Patient Active Problem List   Diagnosis Date Noted   Alcohol use disorder, severe, dependence ([place]) 08/09/2019   Class 1 obesity in adult 08/09/2019   DOE (dyspnea on exertion) 08/09/2019   Hypertension 08/09/2019   OSA (obstructive sleep apnea) 08/09/2019   Transaminitis 08/09/2019   Sudden-onset sensorineural hearing loss 07/23/2019   Tinnitus aurium, left 07/23/2019   Mild concussion 06/14/2017   Avitaminosis D 08/08/2015   Acid reflux 08/02/2015    Past Surgical History:  Procedure Laterality Date   COLONOSCOPY  06/2018   FOOT SURGERY Bilateral    "had a pin put in my toes in 09/2019 and 2018, but I'm not sure what surgery it was specifically"    [place] N/A 03/14/2021   Procedure: MRI WITH ANESTHESIA  BRAIN TO INCLUDE IAC WITH AND WITHOUT CONTRAST;  Surgeon: Radiologist, Medication, MD;  Location: [facility];  Service: Radiology;  Laterality: N/A;       Family History  Problem Relation Age of Onset   Diabetes Mother    Diabetes Father    Heart disease Father    Diabetes Maternal Grandmother    Diabetes Maternal Grandfather    Heart attack Maternal Grandfather    Diabetes Paternal Grandmother    Throat cancer Paternal Grandmother    Diabetes Paternal Grandfather    Heart attack Paternal Grandfather    Colon cancer  Paternal Grandfather    Colon polyps Paternal Grandfather    Rectal cancer Neg Hx    Stomach cancer Neg Hx     Social History   Tobacco Use   Smoking status: Former    Years: 4.00    Types: Cigarettes   Smokeless tobacco: Never   Tobacco comments:    Quit 2004   Vaping Use   Vaping Use: Never used  Substance Use Topics   Alcohol use: Yes    Alcohol/week: 11.0 standard drinks    Types: 6 Cans of beer, 5 Shots of liquor per week   Drug use: No    Home Medications Prior to Admission medications   Medication Sig Start Date End Date Taking? Authorizing Provider  amoxicillin-clavulanate (AUGMENTIN) 875-125 MG tablet Take 1 tablet by mouth 2 (two) times daily. 03/03/21   [provider]  desonide (DESOWEN) 0.05 % lotion Apply 1 application topically daily as needed (facial skin patch). 12/12/20   [provider]  esomeprazole (NEXIUM) 40 MG capsule Take 40 mg by mouth 2 (two) times daily. 03/03/21   [provider]  levocetirizine (XYZAL) 5 MG tablet Take 5 mg by mouth every evening. 03/03/21   [provider]  naproxen sodium (ALEVE) 220 MG tablet Take 220 mg by mouth daily  as needed (pain).    [provider]  olmesartan-hydrochlorothiazide (BENICAR HCT) 20-12.5 MG tablet Take 1 tablet by mouth in the morning.    [provider]  sertraline (ZOLOFT) 50 MG tablet Take 50 mg by mouth in the morning. 12/19/20   [provider]    Allergies    Erythromycin base  Review of Systems   Review of Systems  Constitutional:  Negative for chills and fever.  HENT:  Negative for ear pain and sore throat.   Eyes:  Negative for pain and visual disturbance.  Respiratory:  Positive for cough. Negative for shortness of breath.   Cardiovascular:  Negative for chest pain and palpitations.  Gastrointestinal:  Positive for abdominal pain, diarrhea, nausea and vomiting.  Genitourinary:  Negative for dysuria and hematuria.  Musculoskeletal:   Negative for arthralgias and back pain.  Skin:  Negative for color change and rash.  Neurological:  Negative for seizures and syncope.  All other systems reviewed and are negative.  Physical Exam Updated Vital Signs BP (!) 166/107 (BP Location: Left Arm)    Pulse (!) 127    Temp 98.7 F (37.1 C) (Oral)    Resp 19    Ht 1.803 m ('5\' 11"' )    Wt 94.3 kg    SpO2 95%    BMI 29.01 kg/m   Physical Exam Vitals and nursing note reviewed.  Constitutional:      General: He is not in acute distress.    Appearance: Normal appearance. He is well-developed.  HENT:     Head: Normocephalic and atraumatic.  Eyes:     Extraocular Movements: Extraocular movements intact.     Conjunctiva/sclera: Conjunctivae normal.     Pupils: Pupils are equal, round, and reactive to light.  Cardiovascular:     Rate and Rhythm: Regular rhythm. Tachycardia present.     Heart sounds: No murmur heard. Pulmonary:     Effort: Pulmonary effort is normal. No respiratory distress.     Breath sounds: Normal breath sounds.  Abdominal:     General: There is no distension.     Palpations: Abdomen is soft.     Tenderness: There is no abdominal tenderness.  Musculoskeletal:        General: No swelling.     Cervical back: Neck supple.  Skin:    General: Skin is warm and dry.     Capillary Refill: Capillary refill takes less than 2 seconds.  Neurological:     General: No focal deficit present.     Mental Status: He is alert and oriented to person, place, and time.  Psychiatric:        Mood and Affect: Mood normal.    ED Results / Procedures / Treatments   Labs (all labs ordered are listed, but only abnormal results are displayed) Labs Reviewed  LIPASE, BLOOD - Abnormal; Notable for the following components:      Result Value   Lipase 336 (*)    All other components within normal limits  COMPREHENSIVE METABOLIC PANEL - Abnormal; Notable for the following components:   Sodium 134 (*)    Potassium 3.1 (*)    Chloride  94 (*)    CO2 17 (*)    Glucose, Bld 129 (*)    Calcium 8.6 (*)    AST 140 (*)    ALT 134 (*)    Total Bilirubin 1.7 (*)    Anion gap 23 (*)    All other components within normal limits  CBC - Abnormal; Notable for the following components:   RBC 4.18 (*)    Platelets 124 (*)    All other components within normal limits  URINALYSIS, ROUTINE W REFLEX MICROSCOPIC - Abnormal; Notable for the following components:   Specific Gravity, Urine 1.046 (*)    Ketones, ur >80 (*)    Protein, ur 30 (*)    All other components within normal limits  RESP PANEL BY RT-PCR (FLU A&B, COVID) [id number]    EKG None  Radiology CT Abdomen Pelvis W Contrast  Result Date: 10/27/2021 CLINICAL DATA:  Nausea, vomiting. EXAM: CT ABDOMEN AND PELVIS WITH CONTRAST TECHNIQUE: Multidetector CT imaging of the abdomen and pelvis was performed using the standard protocol following bolus administration of intravenous contrast. CONTRAST:  144m OMNIPAQUE IOHEXOL 300 MG/ML  SOLN COMPARISON:  CT examination dated April 14, 2009 FINDINGS: Lower chest: No acute abnormality. Hepatobiliary: Markedly decreased attenuation of hepatic parenchyma concerning for severe hepatic steatosis. No appreciable hepatic mass. No gallstones, gallbladder wall thickening, or biliary dilatation. Pancreas: Moderate fatty infiltration of the pancreas. There is mild edema of the head of the pancreas/uncinate process with mild generalized mesenteric fat stranding. No pancreatic ductal dilatation. No appreciable mass. No peripancreatic fluid collection. Spleen: Normal in size without focal abnormality. Adrenals/Urinary Tract: Adrenal glands are unremarkable. kidneys are normal, without renal calculi, or hydronephrosis. Very small fat density structure in the upper pole of the left kidney, which may represent angiomyolipoma. Mild bilateral perinephric fat stranding. Bladder is unremarkable. Stomach/Bowel: Stomach is within normal limits. Appendix appears normal.  No evidence of bowel wall thickening, distention, or inflammatory changes. Vascular/Lymphatic: No significant vascular findings are present. No enlarged abdominal or pelvic lymph nodes. Reproductive: Prostate is unremarkable. Other: No abdominal wall hernia or abnormality. No abdominopelvic ascites. Musculoskeletal: No acute or significant osseous findings. Degenerate disc disease prominent at L5-S1. IMPRESSION: 1. Severe hepatic steatosis. 2. Mild edema of the head of the pancreas and moderate pancreatic atrophy. Mild mesenteric fat stranding about the pancreas concerning for subacute pancreatitis or early pancreatitis. No adjacent drainable fluid collection. Clinical correlation is suggested. Electronically Signed   By: IKeane PoliceD.O.   On: 10/27/2021 18:14   DG Chest Port 1 View  Result Date: 10/27/2021 CLINICAL DATA:  Nausea and vomiting.  Body aches. EXAM: PORTABLE CHEST 1 VIEW COMPARISON:  None. FINDINGS: Numerous leads and wires project over the chest. Midline trachea.  Normal heart size and mediastinal contours. Sharp costophrenic angles.  No pneumothorax.  Clear lungs. IMPRESSION: No active disease. Electronically Signed   By: KAbigail MiyamotoM.D.   On: 10/27/2021 18:18    Procedures Procedures   Medications Ordered in ED Medications  ondansetron (ZOFRAN-ODT) 4 MG disintegrating tablet (has no administration in time range)  0.9 %  sodium chloride infusion ( Intravenous New Bag/Given 10/27/21 1928)  ondansetron (ZOFRAN-ODT) disintegrating tablet 4 mg (4 mg Oral Given 10/27/21 1529)  sodium chloride 0.9 % bolus 1,000 mL (1,000 mLs Intravenous New Bag/Given 10/27/21 1717)  ondansetron (ZOFRAN) injection 4 mg (4 mg Intravenous Given 10/27/21 1716)  iohexol (OMNIPAQUE) 300 MG/ML solution 100 mL (100 mLs Intravenous Contrast Given 10/27/21 1739)  ondansetron (ZOFRAN) injection 4 mg (4 mg Intravenous Given 10/27/21 1928)    ED Course  I have reviewed the triage vital signs and the nursing  notes.  Pertinent labs & imaging results that were available during my care of the patient were reviewed by me and considered in my medical decision making (see chart for details).  MDM Rules/Calculators/A&P                         Patient's labs concerning for pancreatitis.  Lipase 336.  Had some slight liver function test abnormalities.  With a bilirubin of 1.7 AST 140 ALT 134.  Alk phos is normal at 82.  No leukocytosis.  Hemoglobin 14.1.  Lipase was 336.  COVID testing influenza testing is negative.  Chest x-ray negative.  Urinalysis negative.  CT scan consistent with pancreatitis.  And some liver changes.  Most likely this is pancreatitis secondary to alcohol.  Patient is tachycardic.  Not clear whether that is due to the pain or the dehydration.  Patient's been hydrated here.  But will give a dose of Ativan.  Blood pressures have been fine.  Is possible there could be some component of withdrawal.  But patient's mentation is normal.  Will contact hospitalist for admission.   Final Clinical Impression(s) / ED Diagnoses Final diagnoses:  Acute pancreatitis without infection or necrosis, unspecified pancreatitis type    Rx / DC Orders ED Discharge Orders     None        Fredia Sorrow, MD 10/27/21 1955

## 2021-10-27 NOTE — ED Triage Notes (Signed)
Pt c/o N/V/D, body aches for approximately one week. Pt also with a cough during that time. Denies fever, no abdominal pain per pt.

## 2021-10-27 NOTE — ED Notes (Signed)
Called [facility] - advised that patient has a bed ready @ [facility], room 1502 - [facility] advised they would send a truck

## 2021-10-27 NOTE — Assessment & Plan Note (Signed)
Continue with sertraline and Wellbutrin.

## 2021-10-27 NOTE — H&P (Addendum)
History and Physical    Miquel Stacks IDP:[id number] DOB: Jul 09, 1976 DOA: 10/27/2021  PCP: Lewis Moccasin, MD   Patient coming from: Home  I have personally briefly reviewed patient's old medical records in [facility]  Chief complaint: Persistent vomiting and nausea History present illness: 45 year old male with a history of depression, reflux, alcohol use presents to the ER today with a 1 week history of nausea, vomiting.  States that he started feeling sick on Sunday.  Has been unable keep anything down including water.  No fevers.  No chills.  He has had somewhat of a cough since all this vomiting started.  Patient states he drinks 3-4 times a week.  He only has 2-3 drinks at a time.  He denies problem with alcohol use though he has been seen in the ER for alcoholic intoxication and a fall in the past.  Work-up in the ER included an elevated lipase of 336.  CT of his abdomen demonstrated severe hepatic steatosis.  There are some edema of the head of the pancreas and moderate pancreatic atrophy.  Due to elevated lipase, pancreatitis on CT, and early alcohol withdrawal, patient transferred to [facility] for further evaluation.   ED Course: elevated lipase. Pancreatic edema on CT. Severe hepatic steatosis on CT.  Review of Systems:  Review of Systems  Constitutional: Negative.  Negative for chills, fever and weight loss.  HENT: Negative.    Eyes: Negative.   Respiratory:  Positive for cough.   Cardiovascular: Negative.   Gastrointestinal:  Positive for abdominal pain, diarrhea, nausea and vomiting. Negative for blood in stool, constipation and melena.  Genitourinary: Negative.   Musculoskeletal: Negative.   Skin: Negative.   Neurological: Negative.   Endo/Heme/Allergies: Negative.   Psychiatric/Behavioral: Negative.    All other systems reviewed and are negative.  Past Medical History:  Diagnosis Date   Allergy    seasonal   Hypertension    Sleep apnea     Past  Surgical History:  Procedure Laterality Date   COLONOSCOPY  06/2018   FOOT SURGERY Bilateral    "had a pin put in my toes in 09/2019 and 2018, but I'm not sure what surgery it was specifically"    LUMBAR DISC SURGERY     RADIOLOGY WITH ANESTHESIA N/A 03/14/2021   Procedure: MRI WITH ANESTHESIA  BRAIN TO INCLUDE IAC WITH AND WITHOUT CONTRAST;  Surgeon: Radiologist, Medication, MD;  Location: [facility] OR;  Service: Radiology;  Laterality: N/A;     reports that he has quit smoking. He has never used smokeless tobacco. He reports current alcohol use of about 11.0 standard drinks per week. He reports that he does not use drugs.  Allergies  Allergen Reactions   Erythromycin Base Itching and Rash    Family History  Problem Relation Age of Onset   Diabetes Mother    Diabetes Father    Heart disease Father    Diabetes Maternal Grandmother    Diabetes Maternal Grandfather    Heart attack Maternal Grandfather    Diabetes Paternal Grandmother    Throat cancer Paternal Grandmother    Diabetes Paternal Grandfather    Heart attack Paternal Grandfather    Colon cancer Paternal Grandfather    Colon polyps Paternal Grandfather    Rectal cancer Neg Hx    Stomach cancer Neg Hx     Prior to Admission medications   Medication Sig Start Date End Date Taking? Authorizing Provider  ALPRAZolam (XANAX) 0.25 MG tablet Take 0.25-0.5 mg by  mouth daily as needed for anxiety. 09/27/21  Yes [provider]  amLODipine (NORVASC) 5 MG tablet Take 5 mg by mouth daily. 09/26/21  Yes [provider]  buPROPion (WELLBUTRIN XL) 150 MG 24 hr tablet Take 150 mg by mouth every morning. 07/18/21  Yes [provider]  desonide (DESOWEN) 0.05 % lotion Apply 1 application topically daily as needed (facial skin patch). 12/12/20  Yes [provider]  pantoprazole (PROTONIX) 40 MG tablet Take 40 mg by mouth 2 (two) times daily.   Yes [provider]  sertraline (ZOLOFT) 50 MG tablet Take  50 mg by mouth in the morning. 12/19/20  Yes [provider]  zolpidem (AMBIEN CR) 6.25 MG CR tablet Take 6.25 mg by mouth at bedtime. 09/26/21  Yes [provider]    Physical Exam: Vitals:   10/27/21 2115 10/27/21 2139 10/27/21 2145 10/27/21 2239  BP: (!) 176/102  (!) 158/83 (!) 164/100  Pulse: (!) 112  (!) 120 (!) 109  Resp: 15  15 20   Temp:  98.2 F (36.8 C)  98.2 F (36.8 C)  TempSrc:    Oral  SpO2: 97%  96% 99%  Weight:      Height:        Physical Exam Vitals and nursing note reviewed.  Constitutional:      General: He is not in acute distress.    Appearance: Normal appearance. He is not ill-appearing, toxic-appearing or diaphoretic.  HENT:     Head: Normocephalic and atraumatic.     Nose: Nose normal. No rhinorrhea.  Eyes:     General:        Right eye: No discharge.        Left eye: No discharge.  Cardiovascular:     Rate and Rhythm: Normal rate and regular rhythm.     Pulses: Normal pulses.  Pulmonary:     Effort: Pulmonary effort is normal. No respiratory distress.     Breath sounds: Normal breath sounds. No wheezing or rales.  Abdominal:     General: Bowel sounds are normal. There is no distension.     Tenderness: There is no abdominal tenderness. There is no guarding or rebound.  Musculoskeletal:     Right lower leg: No edema.     Left lower leg: No edema.  Skin:    General: Skin is warm and dry.     Capillary Refill: Capillary refill takes less than 2 seconds.  Neurological:     General: No focal deficit present.     Mental Status: He is alert and oriented to person, place, and time.     Comments: Mild hand tremors.  Negative asterixis.     Labs on Admission: I have personally reviewed following labs and imaging studies  CBC: Recent Labs  Lab 10/27/21 1529  WBC 9.3  HGB 14.1  HCT 40.8  MCV 97.6  PLT 124*   Basic Metabolic Panel: Recent Labs  Lab 10/27/21 1529  NA 134*  K 3.1*  CL 94*  CO2 17*  GLUCOSE 129*  BUN 19   CREATININE 0.77  CALCIUM 8.6*   GFR: Estimated Creatinine Clearance: 139.6 mL/min (by C-G formula based on SCr of 0.77 mg/dL). Liver Function Tests: Recent Labs  Lab 10/27/21 1529  AST 140*  ALT 134*  ALKPHOS 82  BILITOT 1.7*  PROT 7.9  ALBUMIN 5.0   Recent Labs  Lab 10/27/21 1529  LIPASE 336*   No results for input(s): AMMONIA in the last 168  hours. Coagulation Profile: No results for input(s): INR, PROTIME in the last 168 hours. Cardiac Enzymes: No results for input(s): CKTOTAL, CKMB, CKMBINDEX, TROPONINI in the last 168 hours. BNP (last 3 results) No results for input(s): PROBNP in the last 8760 hours. HbA1C: No results for input(s): HGBA1C in the last 72 hours. CBG: No results for input(s): GLUCAP in the last 168 hours. Lipid Profile: No results for input(s): CHOL, HDL, LDLCALC, TRIG, CHOLHDL, LDLDIRECT in the last 72 hours. Thyroid Function Tests: No results for input(s): TSH, T4TOTAL, FREET4, T3FREE, THYROIDAB in the last 72 hours. Anemia Panel: No results for input(s): VITAMINB12, FOLATE, FERRITIN, TIBC, IRON, RETICCTPCT in the last 72 hours. Urine analysis:    Component Value Date/Time   COLORURINE YELLOW 10/27/2021 1830   APPEARANCEUR CLEAR 10/27/2021 1830   LABSPEC 1.046 (H) 10/27/2021 1830   PHURINE 6.0 10/27/2021 1830   GLUCOSEU NEGATIVE 10/27/2021 1830   HGBUR NEGATIVE 10/27/2021 1830   BILIRUBINUR NEGATIVE 10/27/2021 1830   KETONESUR >80 (A) 10/27/2021 1830   PROTEINUR 30 (A) 10/27/2021 1830   UROBILINOGEN 0.2 04/14/2009 0048   NITRITE NEGATIVE 10/27/2021 1830   LEUKOCYTESUR NEGATIVE 10/27/2021 1830    Radiological Exams on Admission: I have personally reviewed images CT Abdomen Pelvis W Contrast  Result Date: 10/27/2021 CLINICAL DATA:  Nausea, vomiting. EXAM: CT ABDOMEN AND PELVIS WITH CONTRAST TECHNIQUE: Multidetector CT imaging of the abdomen and pelvis was performed using the standard protocol following bolus administration of intravenous  contrast. CONTRAST:  OMNIPAQUE IOHEXOL 300 MG/ML  SOLN COMPARISON:  CT examination dated April 14, 2009 FINDINGS: Lower chest: No acute abnormality. Hepatobiliary: Markedly decreased attenuation of hepatic parenchyma concerning for severe hepatic steatosis. No appreciable hepatic mass. No gallstones, gallbladder wall thickening, or biliary dilatation. Pancreas: Moderate fatty infiltration of the pancreas. There is mild edema of the head of the pancreas/uncinate process with mild generalized mesenteric fat stranding. No pancreatic ductal dilatation. No appreciable mass. No peripancreatic fluid collection. Spleen: Normal in size without focal abnormality. Adrenals/Urinary Tract: Adrenal glands are unremarkable. kidneys are normal, without renal calculi, or hydronephrosis. Very small fat density structure in the upper pole of the left kidney, which may represent angiomyolipoma. Mild bilateral perinephric fat stranding. Bladder is unremarkable. Stomach/Bowel: Stomach is within normal limits. Appendix appears normal. No evidence of bowel wall thickening, distention, or inflammatory changes. Vascular/Lymphatic: No significant vascular findings are present. No enlarged abdominal or pelvic lymph nodes. Reproductive: Prostate is unremarkable. Other: No abdominal wall hernia or abnormality. No abdominopelvic ascites. Musculoskeletal: No acute or significant osseous findings. Degenerate disc disease prominent at L5-S1. IMPRESSION: 1. Severe hepatic steatosis. 2. Mild edema of the head of the pancreas and moderate pancreatic atrophy. Mild mesenteric fat stranding about the pancreas concerning for subacute pancreatitis or early pancreatitis. No adjacent drainable fluid collection. Clinical correlation is suggested. Electronically Signed   By: Larose Hires D.O.   On: 10/27/2021 18:14   DG Chest Port 1 View  Result Date: 10/27/2021 CLINICAL DATA:  Nausea and vomiting.  Body aches. EXAM: PORTABLE CHEST 1 VIEW COMPARISON:   None. FINDINGS: Numerous leads and wires project over the chest. Midline trachea.  Normal heart size and mediastinal contours. Sharp costophrenic angles.  No pneumothorax.  Clear lungs. IMPRESSION: No active disease. Electronically Signed   By: Jeronimo Greaves M.D.   On: 10/27/2021 18:18    EKG: I have personally reviewed EKG: no EKG performed  Assessment/Plan Principal Problem:   Acute pancreatitis Active Problems:   Alcohol use disorder, severe, dependence (  HCC)   Acid reflux   Depression   Essential hypertension    Acute pancreatitis Admit to medical bed. IVF. NPO except ice chips. Scheduled IV zofran. Advanced to liquid diet in AM. Repeat lipase in AM.  Alcohol use disorder, severe, dependence (HCC) Continue CIWA protocol.  Patient downplaying how much he drinks.  He only states he has 2-3 drinks a day and only drinks 3-4 times a week.  Patient with obvious tremors.  Patient asking for Ativan.  Acid reflux Patient with a least one weeks worth of vomiting.  Start IV Protonix.  Denies any hematemesis.  Depression Continue with sertraline and Wellbutrin.  Essential hypertension Continue norvasc  DVT prophylaxis: Lovenox Code Status: Full Code Family Communication: no family at bedside  Disposition Plan: return home  Consults called: none  Admission status: Observation, Med-Surg   Carollee Herter, DO [facility] 10/28/2021, 12:02 AM

## 2021-10-27 NOTE — Assessment & Plan Note (Addendum)
Notes he binge drinks typically, has quit before Minimal withdrawal symptoms He declines librium at discharge Discussed meds (naltrexone, acamprosate), he's uninterested at this time Discussed importance of cessation - risks of continued drinking - seems to understand

## 2021-10-27 NOTE — Subjective & Objective (Signed)
Chief complaint: Persistent vomiting and nausea History present illness: 45 year old male with a history of depression, reflux, alcohol use presents to the ER today with a 1 week history of nausea, vomiting.  States that he started feeling sick on Sunday.  Has been unable keep anything down including water.  No fevers.  No chills.  He has had somewhat of a cough since all this vomiting started.  Patient states he drinks 3-4 times a week.  He only has 2-3 drinks at a time.  He denies problem with alcohol use though he has been seen in the ER for alcoholic intoxication and a fall in the past.  Work-up in the ER included an elevated lipase of 336.  CT of his abdomen demonstrated severe hepatic steatosis.  There are some edema of the head of the pancreas and moderate pancreatic atrophy.  Due to elevated lipase, pancreatitis on CT, and early alcohol withdrawal, patient transferred to [facility] for further evaluation.

## 2021-10-27 NOTE — Plan of Care (Signed)
45 yo M with acute pancreatitis.  EtOH pancreatitis it sounds like.  Uncontrolled N/V.  No abd pain which is kinda odd.  CT and lipase confirm pancreatitis.  Got put on CIWA as well.  Sending to tele bed (tachycardia).  [facility] will assume care on arrival to accepting facility. Until arrival, care as per EDP. However, [facility] available 24/7 for questions and assistance.  Nursing staff, please page [facility] Admits and Consults [phone]) as soon as the patient arrives the hospital.

## 2021-10-28 ENCOUNTER — Encounter (HOSPITAL_COMMUNITY): Payer: Self-pay | Admitting: Internal Medicine

## 2021-10-28 ENCOUNTER — Observation Stay (HOSPITAL_COMMUNITY): Payer: BC Managed Care – PPO

## 2021-10-28 DIAGNOSIS — K859 Acute pancreatitis without necrosis or infection, unspecified: Secondary | ICD-10-CM | POA: Diagnosis present

## 2021-10-28 DIAGNOSIS — Z79899 Other long term (current) drug therapy: Secondary | ICD-10-CM | POA: Diagnosis not present

## 2021-10-28 DIAGNOSIS — R000 Tachycardia, unspecified: Secondary | ICD-10-CM | POA: Diagnosis not present

## 2021-10-28 DIAGNOSIS — K852 Alcohol induced acute pancreatitis without necrosis or infection: Secondary | ICD-10-CM | POA: Diagnosis present

## 2021-10-28 DIAGNOSIS — K219 Gastro-esophageal reflux disease without esophagitis: Secondary | ICD-10-CM | POA: Diagnosis present

## 2021-10-28 DIAGNOSIS — Z6829 Body mass index (BMI) 29.0-29.9, adult: Secondary | ICD-10-CM | POA: Diagnosis not present

## 2021-10-28 DIAGNOSIS — K760 Fatty (change of) liver, not elsewhere classified: Secondary | ICD-10-CM | POA: Diagnosis not present

## 2021-10-28 DIAGNOSIS — E669 Obesity, unspecified: Secondary | ICD-10-CM | POA: Diagnosis present

## 2021-10-28 DIAGNOSIS — Z87891 Personal history of nicotine dependence: Secondary | ICD-10-CM | POA: Diagnosis not present

## 2021-10-28 DIAGNOSIS — F1020 Alcohol dependence, uncomplicated: Secondary | ICD-10-CM | POA: Diagnosis present

## 2021-10-28 DIAGNOSIS — Z881 Allergy status to other antibiotic agents status: Secondary | ICD-10-CM | POA: Diagnosis not present

## 2021-10-28 DIAGNOSIS — G4733 Obstructive sleep apnea (adult) (pediatric): Secondary | ICD-10-CM | POA: Diagnosis present

## 2021-10-28 DIAGNOSIS — E876 Hypokalemia: Secondary | ICD-10-CM | POA: Diagnosis present

## 2021-10-28 DIAGNOSIS — Z8249 Family history of ischemic heart disease and other diseases of the circulatory system: Secondary | ICD-10-CM | POA: Diagnosis not present

## 2021-10-28 DIAGNOSIS — F32A Depression, unspecified: Secondary | ICD-10-CM | POA: Diagnosis present

## 2021-10-28 DIAGNOSIS — I10 Essential (primary) hypertension: Secondary | ICD-10-CM | POA: Diagnosis present

## 2021-10-28 DIAGNOSIS — D696 Thrombocytopenia, unspecified: Secondary | ICD-10-CM | POA: Diagnosis present

## 2021-10-28 DIAGNOSIS — Z7141 Alcohol abuse counseling and surveillance of alcoholic: Secondary | ICD-10-CM | POA: Diagnosis not present

## 2021-10-28 DIAGNOSIS — Z20822 Contact with and (suspected) exposure to covid-19: Secondary | ICD-10-CM | POA: Diagnosis present

## 2021-10-28 LAB — MAGNESIUM: Magnesium: 2.4 mg/dL (ref 1.7–2.4)

## 2021-10-28 LAB — COMPREHENSIVE METABOLIC PANEL
ALT: 94 U/L — ABNORMAL HIGH (ref 0–44)
AST: 92 U/L — ABNORMAL HIGH (ref 15–41)
Albumin: 4 g/dL (ref 3.5–5.0)
Alkaline Phosphatase: 61 U/L (ref 38–126)
Anion gap: 13 (ref 5–15)
BUN: 15 mg/dL (ref 6–20)
CO2: 19 mmol/L — ABNORMAL LOW (ref 22–32)
Calcium: 7.8 mg/dL — ABNORMAL LOW (ref 8.9–10.3)
Chloride: 103 mmol/L (ref 98–111)
Creatinine, Ser: 0.72 mg/dL (ref 0.61–1.24)
GFR, Estimated: >60 mL/min (ref >60)
Glucose, Bld: 92 mg/dL (ref 70–99)
Potassium: 3.4 mmol/L — ABNORMAL LOW (ref 3.5–5.1)
Sodium: 135 mmol/L (ref 135–145)
Total Bilirubin: 2.2 mg/dL — ABNORMAL HIGH (ref 0.3–1.2)
Total Protein: 6.6 g/dL (ref 6.5–8.1)

## 2021-10-28 LAB — LIPASE, BLOOD: Lipase: 144 U/L — ABNORMAL HIGH (ref 11–51)

## 2021-10-28 LAB — CBC WITH DIFFERENTIAL/PLATELET
Abs Immature Granulocytes: 0.03 10*3/uL (ref 0.00–0.07)
Basophils Absolute: 0 10*3/uL (ref 0.0–0.1)
Basophils Relative: 0 %
Eosinophils Absolute: 0 10*3/uL (ref 0.0–0.5)
Eosinophils Relative: 0 %
HCT: 35.8 % — ABNORMAL LOW (ref 39.0–52.0)
Hemoglobin: 12.2 g/dL — ABNORMAL LOW (ref 13.0–17.0)
Immature Granulocytes: 1 %
Lymphocytes Relative: 14 %
Lymphs Abs: 0.6 10*3/uL — ABNORMAL LOW (ref 0.7–4.0)
MCH: 34.9 pg — ABNORMAL HIGH (ref 26.0–34.0)
MCHC: 34.1 g/dL (ref 30.0–36.0)
MCV: 102.3 fL — ABNORMAL HIGH (ref 80.0–100.0)
Monocytes Absolute: 0.7 10*3/uL (ref 0.1–1.0)
Monocytes Relative: 14 %
Neutro Abs: 3.2 10*3/uL (ref 1.7–7.7)
Neutrophils Relative %: 71 %
Platelets: 64 10*3/uL — ABNORMAL LOW (ref 150–400)
RBC: 3.5 MIL/uL — ABNORMAL LOW (ref 4.22–5.81)
RDW: 11.9 % (ref 11.5–15.5)
WBC: 4.6 10*3/uL (ref 4.0–10.5)
nRBC: 0 % (ref 0.0–0.2)

## 2021-10-28 LAB — HIV ANTIBODY (ROUTINE TESTING W REFLEX): HIV Screen 4th Generation wRfx: NONREACTIVE

## 2021-10-28 LAB — PROTIME-INR
INR: 1.2 (ref 0.8–1.2)
Prothrombin Time: 14.7 seconds (ref 11.4–15.2)

## 2021-10-28 MED ORDER — MELATONIN 5 MG PO TABS
10.0000 mg | ORAL_TABLET | Freq: Every day | ORAL | Status: DC
  Administered 2021-10-28 (×2): 10 mg via ORAL
  Filled 2021-10-28 (×2): qty 2

## 2021-10-28 MED ORDER — LORAZEPAM 2 MG/ML IJ SOLN: 0.0000 mg | Freq: Four times a day (QID) | INTRAMUSCULAR | Status: DC

## 2021-10-28 MED ORDER — ONDANSETRON HCL 4 MG/2ML IJ SOLN
4.0000 mg | Freq: Four times a day (QID) | INTRAMUSCULAR | Status: DC
  Administered 2021-10-28 (×3): 4 mg via INTRAVENOUS
  Filled 2021-10-28 (×3): qty 2

## 2021-10-28 MED ORDER — ACETAMINOPHEN 325 MG PO TABS: 650.0000 mg | ORAL_TABLET | Freq: Four times a day (QID) | ORAL | Status: DC | PRN

## 2021-10-28 MED ORDER — BOOST / RESOURCE BREEZE PO LIQD CUSTOM
1.0000 | Freq: Three times a day (TID) | ORAL | Status: DC
  Administered 2021-10-28 – 2021-10-29 (×4): 1 via ORAL

## 2021-10-28 MED ORDER — BUPROPION HCL ER (XL) 150 MG PO TB24
150.0000 mg | ORAL_TABLET | Freq: Every morning | ORAL | Status: DC
  Administered 2021-10-28 – 2021-10-29 (×2): 150 mg via ORAL
  Filled 2021-10-28 (×2): qty 1

## 2021-10-28 MED ORDER — SODIUM CHLORIDE 0.9 % IV SOLN: INTRAVENOUS | Status: DC

## 2021-10-28 MED ORDER — SERTRALINE HCL 50 MG PO TABS
50.0000 mg | ORAL_TABLET | Freq: Every morning | ORAL | Status: DC
  Administered 2021-10-28 – 2021-10-29 (×2): 50 mg via ORAL
  Filled 2021-10-28 (×2): qty 1

## 2021-10-28 MED ORDER — AMLODIPINE BESYLATE 5 MG PO TABS
5.0000 mg | ORAL_TABLET | Freq: Every day | ORAL | Status: DC
  Administered 2021-10-28 – 2021-10-29 (×2): 5 mg via ORAL
  Filled 2021-10-28 (×2): qty 1

## 2021-10-28 MED ORDER — ENOXAPARIN SODIUM 40 MG/0.4ML IJ SOSY
40.0000 mg | PREFILLED_SYRINGE | INTRAMUSCULAR | Status: DC
  Administered 2021-10-28: 11:00:00 40 mg via SUBCUTANEOUS
  Filled 2021-10-28: qty 0.4

## 2021-10-28 MED ORDER — PANTOPRAZOLE SODIUM 40 MG IV SOLR
40.0000 mg | Freq: Two times a day (BID) | INTRAVENOUS | Status: DC
  Administered 2021-10-28 – 2021-10-29 (×4): 40 mg via INTRAVENOUS
  Filled 2021-10-28 (×4): qty 40

## 2021-10-28 MED ORDER — LACTATED RINGERS IV SOLN: INTRAVENOUS | Status: DC

## 2021-10-28 MED ORDER — LORAZEPAM 1 MG PO TABS
0.0000 mg | ORAL_TABLET | Freq: Four times a day (QID) | ORAL | Status: DC
  Administered 2021-10-28: 21:00:00 1 mg via ORAL
  Administered 2021-10-28 (×2): 2 mg via ORAL
  Filled 2021-10-28: qty 1
  Filled 2021-10-28 (×2): qty 2

## 2021-10-28 MED ORDER — POTASSIUM CHLORIDE CRYS ER 20 MEQ PO TBCR
40.0000 meq | EXTENDED_RELEASE_TABLET | ORAL | Status: AC
  Administered 2021-10-28 (×2): 40 meq via ORAL
  Filled 2021-10-28 (×2): qty 2

## 2021-10-28 MED ORDER — ACETAMINOPHEN 650 MG RE SUPP: 650.0000 mg | Freq: Four times a day (QID) | RECTAL | Status: DC | PRN

## 2021-10-28 NOTE — Progress Notes (Signed)
PROGRESS NOTE    Sean Torres  PJA:[id number] DOB: September 10, 1976 DOA: 10/27/2021 PCP: Lewis Moccasin, MD  Chief Complaint  Patient presents with   Emesis   Diarrhea    Brief Narrative:  45 yo with depression, GERD, etoh abuse who presents with 1 week of nausea and vomiting.  Imaging findings c/w pancreatitis, suspect etoh pancreatitis.    Assessment & Plan:   Principal Problem:   Acute pancreatitis Active Problems:   Pancreatitis   Alcohol use disorder, severe, dependence (HCC)   Hepatic steatosis   Acid reflux   Thrombocytopenia (HCC)   Essential hypertension   Depression   * Acute pancreatitis- (present on admission) Suspect etoh pancreatitis based on hx CT abd/pelvis with severe steatosis, subacute or early pancreatitis Labs and imaging c/w pancreatitis, but unusual in that he denies every any pain (only inability to tolerate PO).  Ddx includes etoh gastritis, etc. RUQ [place] negative for gallstones Continue LR Advance diet as tolerated   Hepatic steatosis Elevated LFT's, improving - likely 2/2 etoh  INR 1.2 maddrey discriminant does not support steroids  Alcohol use disorder, severe, dependence (HCC)- (present on admission) Continue CIWA protocol At risk for withdrawal - seemed ok today  Thrombocytopenia (HCC) Suspect related to etoh trend  Acid reflux- (present on admission) PPI  Essential hypertension Continue norvasc  Depression Continue with sertraline and Wellbutrin.     DVT prophylaxis: SCD Code Status: full Family Communication: sig other at bedside Disposition:   Status is: Inpatient  Remains inpatient appropriate because: need to uptitrate diet, IVF       Consultants:  none  Procedures:  none  Antimicrobials:  Anti-infectives (From admission, onward)    None       Subjective: No new complaints  Objective: Vitals:   10/28/21 0000 10/28/21 0431 10/28/21 0826 10/28/21 1242  BP: (!) 162/105 (!) 142/98 (!) 157/105 (!)  149/99  Pulse: (!) 109 96 94 (!) 109  Resp:  20 18 16   Temp:  98.2 F (36.8 C) 98.3 F (36.8 C) 98.7 F (37.1 C)  TempSrc:  Oral Oral Oral  SpO2:  98% 99% 97%  Weight:      Height:        Intake/Output Summary (Last 24 hours) at 10/28/2021 1808 Last data filed at 10/28/2021 1500 Gross per 24 hour  Intake 3312.51 ml  Output 950 ml  Net 2362.51 ml   Filed Weights   10/27/21 1520  Weight: 94.3 kg    Examination:  General exam: Appears calm and comfortable  Respiratory system: unlabored Cardiovascular system:RRR Gastrointestinal system: Abdomen is nondistended, soft and nontender.  Central nervous system: Alert and oriented. No focal neurological deficits. Extremities: no LEE Skin: No rashes, lesions or ulcers Psychiatry: Judgement and insight appear normal. Mood & affect appropriate.     Data Reviewed: I have personally reviewed following labs and imaging studies  CBC: Recent Labs  Lab 10/27/21 1529 10/28/21 0530  WBC 9.3 4.6  NEUTROABS  --  3.2  HGB 14.1 12.2*  HCT 40.8 35.8*  MCV 97.6 102.3*  PLT 124* 64*    Basic Metabolic Panel: Recent Labs  Lab 10/27/21 1529 10/28/21 0530  NA 134* 135  K 3.1* 3.4*  CL 94* 103  CO2 17* 19*  GLUCOSE 129* 92  BUN 19 15  CREATININE 0.77 0.72  CALCIUM 8.6* 7.8*  MG  --  2.4    GFR: Estimated Creatinine Clearance: 139.6 mL/min (by C-G formula based on SCr of 0.72 mg/dL).  Liver  Function Tests: Recent Labs  Lab 10/27/21 1529 10/28/21 0530  AST 140* 92*  ALT 134* 94*  ALKPHOS 82 61  BILITOT 1.7* 2.2*  PROT 7.9 6.6  ALBUMIN 5.0 4.0    CBG: No results for input(s): GLUCAP in the last 168 hours.   Recent Results (from the past 240 hour(s))  Resp Panel by RT-PCR (Flu Arlen Dupuis&B, Covid) Nasopharyngeal Swab     Status: None   Collection Time: 10/27/21  5:19 PM   Specimen: Nasopharyngeal Swab; Nasopharyngeal(NP) swabs in vial transport medium  Result Value Ref Range Status   SARS Coronavirus 2 by RT PCR  NEGATIVE NEGATIVE Final    Comment: (NOTE) SARS-CoV-2 target nucleic acids are NOT DETECTED.  The SARS-CoV-2 RNA is generally detectable in upper respiratory specimens during the acute phase of infection. The lowest concentration of SARS-CoV-2 viral copies this assay can detect is 138 copies/mL. [place] negative result does not preclude SARS-Cov-2 infection and should not be used as the sole basis for treatment or other patient management decisions. [place] negative result may occur with  improper specimen collection/handling, submission of specimen other than nasopharyngeal swab, presence of viral mutation(s) within the areas targeted by this assay, and inadequate number of viral copies(<138 copies/mL). Harleyquinn Gasser negative result must be combined with clinical observations, patient history, and epidemiological information. The expected result is Negative.  Fact Sheet for Patients:  BloggerCourse.com  Fact Sheet for Healthcare Providers:  SeriousBroker.it  This test is no t yet approved or cleared by the [place] FDA and  has been authorized for detection and/or diagnosis of SARS-CoV-2 by FDA under an Emergency Use Authorization (EUA). This EUA will remain  in effect (meaning this test can be used) for the duration of the COVID-19 declaration under Section 564(b)(1) of the Act, 21 U.S.C.section 360bbb-3(b)(1), unless the authorization is terminated  or revoked sooner.       Influenza Demarie Hyneman by PCR NEGATIVE NEGATIVE Final   Influenza B by PCR NEGATIVE NEGATIVE Final    Comment: (NOTE) The Xpert Xpress SARS-CoV-2/FLU/RSV plus assay is intended as an aid in the diagnosis of influenza from Nasopharyngeal swab specimens and should not be used as Kirby Argueta sole basis for treatment. Nasal washings and aspirates are unacceptable for Xpert Xpress SARS-CoV-2/FLU/RSV testing.  Fact Sheet for Patients: BloggerCourse.com  Fact Sheet for  Healthcare Providers: SeriousBroker.it  This test is not yet approved or cleared by the [place] FDA and has been authorized for detection and/or diagnosis of SARS-CoV-2 by FDA under an Emergency Use Authorization (EUA). This EUA will remain in effect (meaning this test can be used) for the duration of the COVID-19 declaration under Section 564(b)(1) of the Act, 21 U.S.C. section 360bbb-3(b)(1), unless the authorization is terminated or revoked.  Performed at [facility], [address], [place], [place] [address]          Radiology Studies: CT Abdomen Pelvis W Contrast  Result Date: 10/27/2021 CLINICAL DATA:  Nausea, vomiting. EXAM: CT ABDOMEN AND PELVIS WITH CONTRAST TECHNIQUE: Multidetector CT imaging of the abdomen and pelvis was performed using the standard protocol following bolus administration of intravenous contrast. CONTRAST:  OMNIPAQUE IOHEXOL 300 MG/ML  SOLN COMPARISON:  CT examination dated April 14, 2009 FINDINGS: Lower chest: No acute abnormality. Hepatobiliary: Markedly decreased attenuation of hepatic parenchyma concerning for severe hepatic steatosis. No appreciable hepatic mass. No gallstones, gallbladder wall thickening, or biliary dilatation. Pancreas: Moderate fatty infiltration of the pancreas. There is mild edema of the head of the pancreas/uncinate process  with mild generalized mesenteric fat stranding. No pancreatic ductal dilatation. No appreciable mass. No peripancreatic fluid collection. Spleen: Normal in size without focal abnormality. Adrenals/Urinary Tract: Adrenal glands are unremarkable. kidneys are normal, without renal calculi, or hydronephrosis. Very small fat density structure in the upper pole of the left kidney, which may represent angiomyolipoma. Mild bilateral perinephric fat stranding. Bladder is unremarkable. Stomach/Bowel: Stomach is within normal limits. Appendix appears normal. No evidence  of bowel wall thickening, distention, or inflammatory changes. Vascular/Lymphatic: No significant vascular findings are present. No enlarged abdominal or pelvic lymph nodes. Reproductive: Prostate is unremarkable. Other: No abdominal wall hernia or abnormality. No abdominopelvic ascites. Musculoskeletal: No acute or significant osseous findings. Degenerate disc disease prominent at L5-S1. IMPRESSION: 1. Severe hepatic steatosis. 2. Mild edema of the head of the pancreas and moderate pancreatic atrophy. Mild mesenteric fat stranding about the pancreas concerning for subacute pancreatitis or early pancreatitis. No adjacent drainable fluid collection. Clinical correlation is suggested. Electronically Signed   By: Larose Hires D.O.   On: 10/27/2021 18:14   DG Chest Port 1 View  Result Date: 10/27/2021 CLINICAL DATA:  Nausea and vomiting.  Body aches. EXAM: PORTABLE CHEST 1 VIEW COMPARISON:  None. FINDINGS: Numerous leads and wires project over the chest. Midline trachea.  Normal heart size and mediastinal contours. Sharp costophrenic angles.  No pneumothorax.  Clear lungs. IMPRESSION: No active disease. Electronically Signed   By: Jeronimo Greaves M.D.   On: 10/27/2021 18:18   US Abdomen Limited RUQ (LIVER/GB)  Result Date: 10/28/2021 CLINICAL DATA:  Pancreatitis EXAM: ULTRASOUND ABDOMEN LIMITED RIGHT UPPER QUADRANT COMPARISON:  None. FINDINGS: Gallbladder: No gallstones or wall thickening visualized. No sonographic Murphy sign noted by sonographer. Common bile duct: Diameter: 3 mm Liver: Increased echogenicity compatible with hepatic steatosis. No large focal abnormality or biliary dilatation. Portal vein is patent on color Doppler imaging with normal direction of blood flow towards the liver. Other: Included views of the right kidney demonstrate no hydronephrosis. No free fluid or ascites. IMPRESSION: Negative for gallstones. Hepatic steatosis. Electronically Signed   By: Judie Petit.  Shick M.D.   On: 10/28/2021 10:49         Scheduled Meds:  amLODipine  5 mg Oral Daily   buPROPion  150 mg Oral q morning   feeding supplement  1 Container Oral TID BM   [START ON 10/30/2021] LORazepam  0-4 mg Intravenous Q12H   Or   [START ON 10/30/2021] LORazepam  0-4 mg Oral Q12H   LORazepam  0-4 mg Intravenous Q6H   Or   LORazepam  0-4 mg Oral Q6H   melatonin  10 mg Oral QHS   ondansetron (ZOFRAN) IV  4 mg Intravenous Q6H   pantoprazole (PROTONIX) IV  40 mg Intravenous Q12H   sertraline  50 mg Oral q AM   thiamine  100 mg Oral Daily   Or   thiamine  100 mg Intravenous Daily   Continuous Infusions:  lactated ringers       LOS: 0 days    Time spent: over 30 min    Lacretia Nicks, MD [facility]   To contact the attending provider between 7A-7P or the covering provider during after hours 7P-7A, please log into the web site [email or web address] and access using universal  password for that web site. If you do not have the password, please call the hospital operator.  10/28/2021, 6:08 PM

## 2021-10-28 NOTE — Progress Notes (Signed)
Transition of Care [facility]) Screening Note  Patient Details  Name: Sean Torres Date of Birth: [birth year]  Transition of [facility]) CM/SW Contact:    Ewing Schlein, LCSW Phone Number: 10/28/2021, 10:01 AM  Transition of Care Department [facility]) has reviewed patient and no TOC needs have been identified at this time. We will continue to monitor patient advancement through interdisciplinary progression rounds. If new patient transition needs arise, please place a TOC consult.

## 2021-10-28 NOTE — Assessment & Plan Note (Addendum)
Suspect related to etoh Trend outpatient

## 2021-10-28 NOTE — Assessment & Plan Note (Signed)
Continue norvasc 

## 2021-10-28 NOTE — Hospital Course (Addendum)
45 yo with depression, GERD, etoh abuse who presents with 1 week of nausea and vomiting.  Imaging findings c/w pancreatitis, suspect etoh pancreatitis.  He's gradually improved with conservative management.  Plan for discharge 12/25 with outpatient follow up, encouraged etoh cessation, follow up with PCP.

## 2021-10-28 NOTE — Assessment & Plan Note (Addendum)
consider GI follow up

## 2021-10-29 DIAGNOSIS — E876 Hypokalemia: Secondary | ICD-10-CM

## 2021-10-29 DIAGNOSIS — R000 Tachycardia, unspecified: Secondary | ICD-10-CM

## 2021-10-29 DIAGNOSIS — K852 Alcohol induced acute pancreatitis without necrosis or infection: Secondary | ICD-10-CM | POA: Diagnosis not present

## 2021-10-29 DIAGNOSIS — R748 Abnormal levels of other serum enzymes: Secondary | ICD-10-CM

## 2021-10-29 LAB — COMPREHENSIVE METABOLIC PANEL
ALT: 85 U/L — ABNORMAL HIGH (ref 0–44)
AST: 77 U/L — ABNORMAL HIGH (ref 15–41)
Albumin: 3.9 g/dL (ref 3.5–5.0)
Alkaline Phosphatase: 66 U/L (ref 38–126)
Anion gap: 8 (ref 5–15)
BUN: 8 mg/dL (ref 6–20)
CO2: 26 mmol/L (ref 22–32)
Calcium: 8 mg/dL — ABNORMAL LOW (ref 8.9–10.3)
Chloride: 98 mmol/L (ref 98–111)
Creatinine, Ser: 0.64 mg/dL (ref 0.61–1.24)
GFR, Estimated: >60 mL/min (ref >60)
Glucose, Bld: 113 mg/dL — ABNORMAL HIGH (ref 70–99)
Potassium: 2.8 mmol/L — ABNORMAL LOW (ref 3.5–5.1)
Sodium: 132 mmol/L — ABNORMAL LOW (ref 135–145)
Total Bilirubin: 1.9 mg/dL — ABNORMAL HIGH (ref 0.3–1.2)
Total Protein: 6.8 g/dL (ref 6.5–8.1)

## 2021-10-29 LAB — CBC WITH DIFFERENTIAL/PLATELET
Abs Immature Granulocytes: 0.02 10*3/uL (ref 0.00–0.07)
Basophils Absolute: 0 10*3/uL (ref 0.0–0.1)
Basophils Relative: 1 %
Eosinophils Absolute: 0.1 10*3/uL (ref 0.0–0.5)
Eosinophils Relative: 2 %
HCT: 37.8 % — ABNORMAL LOW (ref 39.0–52.0)
Hemoglobin: 13.5 g/dL (ref 13.0–17.0)
Immature Granulocytes: 0 %
Lymphocytes Relative: 15 %
Lymphs Abs: 0.8 10*3/uL (ref 0.7–4.0)
MCH: 34.7 pg — ABNORMAL HIGH (ref 26.0–34.0)
MCHC: 35.7 g/dL (ref 30.0–36.0)
MCV: 97.2 fL (ref 80.0–100.0)
Monocytes Absolute: 0.6 10*3/uL (ref 0.1–1.0)
Monocytes Relative: 13 %
Neutro Abs: 3.4 10*3/uL (ref 1.7–7.7)
Neutrophils Relative %: 69 %
Platelets: 66 10*3/uL — ABNORMAL LOW (ref 150–400)
RBC: 3.89 MIL/uL — ABNORMAL LOW (ref 4.22–5.81)
RDW: 11.8 % (ref 11.5–15.5)
WBC: 4.9 10*3/uL (ref 4.0–10.5)
nRBC: 0 % (ref 0.0–0.2)

## 2021-10-29 LAB — MAGNESIUM: Magnesium: 2 mg/dL (ref 1.7–2.4)

## 2021-10-29 LAB — PHOSPHORUS: Phosphorus: 1.1 mg/dL — ABNORMAL LOW (ref 2.5–4.6)

## 2021-10-29 MED ORDER — PANTOPRAZOLE SODIUM 40 MG PO TBEC: 40.0000 mg | DELAYED_RELEASE_TABLET | Freq: Two times a day (BID) | ORAL | Status: DC

## 2021-10-29 MED ORDER — K PHOS MONO-SOD PHOS DI & MONO 155-852-130 MG PO TABS: 500.0000 mg | ORAL_TABLET | Freq: Four times a day (QID) | ORAL | 0 refills | Status: AC

## 2021-10-29 MED ORDER — K PHOS MONO-SOD PHOS DI & MONO 155-852-130 MG PO TABS
500.0000 mg | ORAL_TABLET | Freq: Four times a day (QID) | ORAL | Status: DC
  Administered 2021-10-29 (×2): 500 mg via ORAL
  Filled 2021-10-29 (×2): qty 2

## 2021-10-29 MED ORDER — POTASSIUM CHLORIDE CRYS ER 20 MEQ PO TBCR
40.0000 meq | EXTENDED_RELEASE_TABLET | ORAL | Status: AC
  Administered 2021-10-29 (×2): 40 meq via ORAL
  Filled 2021-10-29 (×2): qty 2

## 2021-10-29 MED ORDER — POTASSIUM CHLORIDE CRYS ER 20 MEQ PO TBCR: 20.0000 meq | EXTENDED_RELEASE_TABLET | Freq: Every day | ORAL | 0 refills | Status: DC

## 2021-10-29 MED ORDER — POTASSIUM CHLORIDE 10 MEQ/100ML IV SOLN
10.0000 meq | INTRAVENOUS | Status: DC
  Administered 2021-10-29 (×2): 10 meq via INTRAVENOUS
  Filled 2021-10-29 (×2): qty 100

## 2021-10-29 NOTE — Progress Notes (Signed)
The patient is receiving Protonix by the intravenous route.  Based on criteria approved by the Pharmacy and Therapeutics Committee and the Medical Executive Committee, the medication is being converted to the equivalent oral dose form.  These criteria include: -No active GI bleeding -Able to tolerate diet of full liquids (or better) or tube feeding -Able to tolerate other medications by the oral or enteral route  If you have any questions about this conversion, please contact the Pharmacy Department (phone [phone]).  Thank you.   Otho Bellows PharmD [facility] [phone] 10/29/2021, 8:47 AM

## 2021-10-29 NOTE — Assessment & Plan Note (Signed)
Discharge with [facility] Follow outpatient

## 2021-10-29 NOTE — Assessment & Plan Note (Addendum)
Elevated LFT's, improving - likely 2/2 etoh  INR 1.2 maddrey discriminant does not support steroids Follow repeat labs with PCP outpatient,

## 2021-10-29 NOTE — Discharge Summary (Signed)
Physician Discharge Summary  Sean Torres NAT:[id number] DOB: [birth year] DOA: 10/27/2021  PCP: Sean Moccasin, MD  Admit date: 10/27/2021 Discharge date: 10/29/2021  Time spent: 40- minutes  Recommendations for Outpatient Follow-up:  Follow outpatient CBC/CMP Follow K and phos outpatient - supplement as needed Encourage etoh cessation outpatient - consider meds if needed Follow LFT's outpatient -> consider GI follow up for steatosis Follow platelets outpatient  If continued GI symptoms, would recommend GI follow up  Tachycardia today - somewhat chronic per his report- seen in clinic visit on 03/2021 (care everywhere to 120's) - he notes it's always fast - attempted to add TSH, but he was unwilling to stay for additional w/u (including EKG).  Recommend outpatient TSH, EKG, and additional workup for tachycardia.  Discharge Diagnoses:  Principal Problem:   Acute pancreatitis Active Problems:   Pancreatitis   Alcohol use disorder, severe, dependence (HCC)   Hepatic steatosis   Elevated liver enzymes   Acid reflux   Thrombocytopenia (HCC)   Essential hypertension   Depression   Hypophosphatemia   Hypokalemia   Regular sinus tachycardia   Discharge Condition: stable  Diet recommendation: heart healthy  Filed Weights   10/27/21 1520  Weight: 94.3 kg    History of present illness:  45 yo with depression, GERD, etoh abuse who presents with 1 week of nausea and vomiting.  Imaging findings c/w pancreatitis, suspect etoh pancreatitis.  He's gradually improved with conservative management.  Plan for discharge 12/25 with outpatient follow up, encouraged etoh cessation, follow up with PCP.  Hospital Course:  * Acute pancreatitis- (present on admission) Suspect etoh pancreatitis based on hx CT abd/pelvis with severe steatosis, subacute or early pancreatitis Labs and imaging c/w pancreatitis, but unusual in that he denies every any pain (only inability to tolerate PO).  Ddx includes  etoh gastritis, etc. RUQ [place] negative for gallstones Advance diet as tolerated - tolerating Callan Norden soft diet today --- discussed importance of etoh cessation, consider GI follow up if persistent symptoms.   Elevated liver enzymes Elevated LFT's, improving - likely 2/2 etoh  INR 1.2 maddrey discriminant does not support steroids Follow repeat labs with PCP outpatient,  Hepatic steatosis consider GI follow up  Alcohol use disorder, severe, dependence (HCC)- (present on admission) Notes he binge drinks typically, has quit before Minimal withdrawal symptoms He declines librium at discharge Discussed meds (naltrexone, acamprosate), he's uninterested at this time Discussed importance of cessation - risks of continued drinking - seems to understand  Thrombocytopenia (HCC) Suspect related to etoh Trend outpatient  Acid reflux- (present on admission) PPI  Essential hypertension Continue norvasc  Hypokalemia Discharge with K Follow outpatient  Hypophosphatemia Discharge with k phos Follow outpatient  Depression Continue with sertraline and Wellbutrin.  Regular sinus tachycardia 120's around time of discharge, regular - ddx includes 2/2 exertion vs etoh withdrawal (he denies withdrawal symptoms) He was eager to discharge and notes chronic tachycardia - didn't want to stay for additional workup He'll need TSH outpatient and additional w/u for tachycardia    Procedures: none   Consultations: none  Discharge Exam: Vitals:   10/29/21 0556 10/29/21 1435  BP: (!) 158/107 (!) 159/110  Pulse: 92 (!) 131  Resp: 20 18  Temp: 98.7 F (37.1 C) 98.7 F (37.1 C)  SpO2: 99% 98%   Eager for discharge Discussion regarding etoh and consequences, offered meds, he's declining today Seems in good spirits and seems to know what he needs to do  Denies CP and SOB  General: No  acute distress. Denies withdrawal sx, notes hx chronic tachycardia Cardiovascular: RRR - repeat HR 120 on my  exam  Lungs: unlabored Abdomen: Soft, nontender, nondistended  Neurological: Alert and oriented 3. Moves all extremities 4. Cranial nerves II through XII grossly intact. Mild tremor, chronic. Skin: Warm and dry. No rashes or lesions. Extremities: No clubbing or cyanosis. No edema. Discharge Instructions   Discharge Instructions     Call MD for:  difficulty breathing, headache or visual disturbances   Complete by: As directed    Call MD for:  extreme fatigue   Complete by: As directed    Call MD for:  hives   Complete by: As directed    Call MD for:  persistant dizziness or light-headedness   Complete by: As directed    Call MD for:  persistant nausea and vomiting   Complete by: As directed    Call MD for:  redness, tenderness, or signs of infection (pain, swelling, redness, odor or green/yellow discharge around incision site)   Complete by: As directed    Call MD for:  severe uncontrolled pain   Complete by: As directed    Call MD for:  temperature >100.4   Complete by: As directed    Diet - low sodium heart healthy   Complete by: As directed    Discharge instructions   Complete by: As directed    You were seen for nausea and vomiting we think was likely related to pancreatitis from alcohol use.  Other things that may have caused your symptoms are inflammation of the stomach lining.    You've gradually improved.  You still have evidence of liver injury on your labs and exam.  Your ultrasound shows steatosis (fatty liver) which is related to alcohol use.  Cessation/stopping further alcohol use is extremely important for your health.  Continued drinking can lead to worsening liver issues, cirrhosis, or other complications from drinking.    If your symptoms continue, I think considering follow up with Ravyn Nikkel gastroenterologist is [name] reasonable consideration.  Talking to your therapist, engaging in [place], discussing medications are options to help with cessation.    Your potassium and  phosphorus were pretty low today, I'll send you home with Nazly Digilio prescription for potassium and phosphorus.  You should have repeat labs within Birt Reinoso week or so with your PCP.  Part of the reason for these deficiency is probably alcohol use to the point you're not getting appropriate nutrition.  You platelets are low and will need to be followed outpatient.  You'll need repeat liver function tests with your PCP outpatient.  Return for new, recurrent, or worsening symptoms.  Please ask your PCP to request records from this hospitalization so they know what was done and what the next steps will be.   Increase activity slowly   Complete by: As directed       Allergies as of 10/29/2021       Reactions   Erythromycin Base Itching, Rash        Medication List     TAKE these medications    ALPRAZolam 0.25 MG tablet Commonly known as: XANAX Take 0.25-0.5 mg by mouth daily as needed for anxiety.   amLODipine 5 MG tablet Commonly known as: NORVASC Take 5 mg by mouth daily.   buPROPion 150 MG 24 hr tablet Commonly known as: WELLBUTRIN XL Take 150 mg by mouth every morning.   desonide 0.05 % lotion Commonly known as: DESOWEN Apply 1 application topically daily as needed (  facial skin patch).   pantoprazole 40 MG tablet Commonly known as: PROTONIX Take 40 mg by mouth 2 (two) times daily.   phosphorus 155-852-130 MG tablet Commonly known as: K PHOS NEUTRAL Take 2 tablets (500 mg total) by mouth 4 (four) times daily for 2 days.   potassium chloride SA 20 MEQ tablet Commonly known as: KLOR-CON M Take 1 tablet (20 mEq total) by mouth daily for 7 days.   sertraline 50 MG tablet Commonly known as: ZOLOFT Take 50 mg by mouth in the morning.   zolpidem 6.25 MG CR tablet Commonly known as: AMBIEN CR Take 6.25 mg by mouth at bedtime.       Allergies  Allergen Reactions   Erythromycin Base Itching and Rash      The results of significant diagnostics from this hospitalization  (including imaging, microbiology, ancillary and laboratory) are listed below for reference.    Significant Diagnostic Studies: CT Abdomen Pelvis W Contrast  Result Date: 10/27/2021 CLINICAL DATA:  Nausea, vomiting. EXAM: CT ABDOMEN AND PELVIS WITH CONTRAST TECHNIQUE: Multidetector CT imaging of the abdomen and pelvis was performed using the standard protocol following bolus administration of intravenous contrast. CONTRAST:  OMNIPAQUE IOHEXOL 300 MG/ML  SOLN COMPARISON:  CT examination dated April 14, 2009 FINDINGS: Lower chest: No acute abnormality. Hepatobiliary: Markedly decreased attenuation of hepatic parenchyma concerning for severe hepatic steatosis. No appreciable hepatic mass. No gallstones, gallbladder wall thickening, or biliary dilatation. Pancreas: Moderate fatty infiltration of the pancreas. There is mild edema of the head of the pancreas/uncinate process with mild generalized mesenteric fat stranding. No pancreatic ductal dilatation. No appreciable mass. No peripancreatic fluid collection. Spleen: Normal in size without focal abnormality. Adrenals/Urinary Tract: Adrenal glands are unremarkable. kidneys are normal, without renal calculi, or hydronephrosis. Very small fat density structure in the upper pole of the left kidney, which may represent angiomyolipoma. Mild bilateral perinephric fat stranding. Bladder is unremarkable. Stomach/Bowel: Stomach is within normal limits. Appendix appears normal. No evidence of bowel wall thickening, distention, or inflammatory changes. Vascular/Lymphatic: No significant vascular findings are present. No enlarged abdominal or pelvic lymph nodes. Reproductive: Prostate is unremarkable. Other: No abdominal wall hernia or abnormality. No abdominopelvic ascites. Musculoskeletal: No acute or significant osseous findings. Degenerate disc disease prominent at L5-S1. IMPRESSION: 1. Severe hepatic steatosis. 2. Mild edema of the head of the pancreas and moderate  pancreatic atrophy. Mild mesenteric fat stranding about the pancreas concerning for subacute pancreatitis or early pancreatitis. No adjacent drainable fluid collection. Clinical correlation is suggested. Electronically Signed   By: Larose Hires D.O.   On: 10/27/2021 18:14   DG Chest Port 1 View  Result Date: 10/27/2021 CLINICAL DATA:  Nausea and vomiting.  Body aches. EXAM: PORTABLE CHEST 1 VIEW COMPARISON:  None. FINDINGS: Numerous leads and wires project over the chest. Midline trachea.  Normal heart size and mediastinal contours. Sharp costophrenic angles.  No pneumothorax.  Clear lungs. IMPRESSION: No active disease. Electronically Signed   By: Jeronimo Greaves M.D.   On: 10/27/2021 18:18   US Abdomen Limited RUQ (LIVER/GB)  Result Date: 10/28/2021 CLINICAL DATA:  Pancreatitis EXAM: ULTRASOUND ABDOMEN LIMITED RIGHT UPPER QUADRANT COMPARISON:  None. FINDINGS: Gallbladder: No gallstones or wall thickening visualized. No sonographic Murphy sign noted by sonographer. Common bile duct: Diameter: 3 mm Liver: Increased echogenicity compatible with hepatic steatosis. No large focal abnormality or biliary dilatation. Portal vein is patent on color Doppler imaging with normal direction of blood flow towards the liver. Other: Included views of the  right kidney demonstrate no hydronephrosis. No free fluid or ascites. IMPRESSION: Negative for gallstones. Hepatic steatosis. Electronically Signed   By: Judie Petit.  Shick M.D.   On: 10/28/2021 10:49    Microbiology: Recent Results (from the past 240 hour(s))  Resp Panel by RT-PCR (Flu Yajaira Doffing&B, Covid) Nasopharyngeal Swab     Status: None   Collection Time: 10/27/21  5:19 PM   Specimen: Nasopharyngeal Swab; Nasopharyngeal(NP) swabs in vial transport medium  Result Value Ref Range Status   SARS Coronavirus 2 by RT PCR NEGATIVE NEGATIVE Final    Comment: (NOTE) SARS-CoV-2 target nucleic acids are NOT DETECTED.  The SARS-CoV-2 RNA is generally detectable in upper  respiratory specimens during the acute phase of infection. The lowest concentration of SARS-CoV-2 viral copies this assay can detect is 138 copies/mL. Shontelle Muska negative result does not preclude SARS-Cov-2 infection and should not be used as the sole basis for treatment or other patient management decisions. Sarabelle Genson negative result may occur with  improper specimen collection/handling, submission of specimen other than nasopharyngeal swab, presence of viral mutation(s) within the areas targeted by this assay, and inadequate number of viral copies(<138 copies/mL). Chalese Peach negative result must be combined with clinical observations, patient history, and epidemiological information. The expected result is Negative.  Fact Sheet for Patients:  BloggerCourse.com  Fact Sheet for Healthcare Providers:  SeriousBroker.it  This test is no t yet approved or cleared by the [place] FDA and  has been authorized for detection and/or diagnosis of SARS-CoV-2 by FDA under an Emergency Use Authorization (EUA). This EUA will remain  in effect (meaning this test can be used) for the duration of the COVID-19 declaration under Section 564(b)(1) of the Act, 21 U.S.C.section 360bbb-3(b)(1), unless the authorization is terminated  or revoked sooner.       Influenza Olivene Cookston by PCR NEGATIVE NEGATIVE Final   Influenza B by PCR NEGATIVE NEGATIVE Final    Comment: (NOTE) The Xpert Xpress SARS-CoV-2/FLU/RSV plus assay is intended as an aid in the diagnosis of influenza from Nasopharyngeal swab specimens and should not be used as Sybrina Laning sole basis for treatment. Nasal washings and aspirates are unacceptable for Xpert Xpress SARS-CoV-2/FLU/RSV testing.  Fact Sheet for Patients: BloggerCourse.com  Fact Sheet for Healthcare Providers: SeriousBroker.it  This test is not yet approved or cleared by the [place] FDA and has been  authorized for detection and/or diagnosis of SARS-CoV-2 by FDA under an Emergency Use Authorization (EUA). This EUA will remain in effect (meaning this test can be used) for the duration of the COVID-19 declaration under Section 564(b)(1) of the Act, 21 U.S.C. section 360bbb-3(b)(1), unless the authorization is terminated or revoked.  Performed at [facility], [address] [place], [place] [address]      Labs: Basic Metabolic Panel: Recent Labs  Lab 10/27/21 1529 10/28/21 0530 10/29/21 0556  NA 134* 135 132*  K 3.1* 3.4* 2.8*  CL 94* 103 98  CO2 17* 19* 26  GLUCOSE 129* 92 113*  BUN 19 15 8   CREATININE 0.77 0.72 0.64  CALCIUM 8.6* 7.8* 8.0*  MG  --  2.4 2.0  PHOS  --   --  1.1*   Liver Function Tests: Recent Labs  Lab 10/27/21 1529 10/28/21 0530 10/29/21 0556  AST 140* 92* 77*  ALT 134* 94* 85*  ALKPHOS 82 61 66  BILITOT 1.7* 2.2* 1.9*  PROT 7.9 6.6 6.8  ALBUMIN 5.0 4.0 3.9   Recent Labs  Lab 10/27/21 1529 10/28/21 0530  LIPASE 336* 144*  No results for input(s): AMMONIA in the last 168 hours. CBC: Recent Labs  Lab 10/27/21 1529 10/28/21 0530 10/29/21 0556  WBC 9.3 4.6 4.9  NEUTROABS  --  3.2 3.4  HGB 14.1 12.2* 13.5  HCT 40.8 35.8* 37.8*  MCV 97.6 102.3* 97.2  PLT 124* 64* 66*   Cardiac Enzymes: No results for input(s): CKTOTAL, CKMB, CKMBINDEX, TROPONINI in the last 168 hours. BNP: BNP (last 3 results) No results for input(s): BNP in the last 8760 hours.  ProBNP (last 3 results) No results for input(s): PROBNP in the last 8760 hours.  CBG: No results for input(s): GLUCAP in the last 168 hours.     Signed:  Lacretia Nicks MD.  [facility] 10/29/2021, 3:43 PM

## 2021-10-29 NOTE — Assessment & Plan Note (Addendum)
Discharge with K Follow outpatient

## 2021-10-29 NOTE — Progress Notes (Signed)
Sean Torres to be D/C'd home per MD order. Discussed with the patient and all questions fully answered.  Skin clean, dry and intact without evidence of skin break down, no evidence of skin tears noted.  IV catheter discontinued intact. Site without signs and symptoms of complications. Dressing and pressure applied.  An After Visit Summary was printed and given to the patient.  Patient escorted via WC, and D/C home via private auto.  Jon Gills  10/29/2021 3:52 PM

## 2021-10-29 NOTE — Assessment & Plan Note (Addendum)
120's around time of discharge, regular - ddx includes 2/2 exertion vs etoh withdrawal (he denies withdrawal symptoms) He was eager to discharge and notes chronic tachycardia - didn't want to stay for additional workup He'll need TSH outpatient and additional w/u for tachycardia

## 2021-11-09 DIAGNOSIS — G4700 Insomnia, unspecified: Secondary | ICD-10-CM | POA: Diagnosis not present

## 2021-11-09 DIAGNOSIS — R945 Abnormal results of liver function studies: Secondary | ICD-10-CM | POA: Diagnosis not present

## 2021-11-09 DIAGNOSIS — I10 Essential (primary) hypertension: Secondary | ICD-10-CM | POA: Diagnosis not present

## 2021-11-09 DIAGNOSIS — Z23 Encounter for immunization: Secondary | ICD-10-CM | POA: Diagnosis not present

## 2021-11-09 DIAGNOSIS — F411 Generalized anxiety disorder: Secondary | ICD-10-CM | POA: Diagnosis not present

## 2021-11-09 DIAGNOSIS — F331 Major depressive disorder, recurrent, moderate: Secondary | ICD-10-CM | POA: Diagnosis not present

## 2021-12-08 IMAGING — MR MR BRAIN/IAC WO/W CM
13 of 16 series · 29 of 48 positions shown · IV contrast (gadavist)
Comparison: Brain MRI 06/17/2018.  Head CT 06/24/2017.

CLINICAL DATA: Asymmetrical sensorineural hearing loss.

EXAM:
MRI HEAD WITHOUT AND WITH CONTRAST
TECHNIQUE: Multiplanar, multiecho pulse sequences of the brain and surrounding
structures were obtained without and with intravenous contrast.
CONTRAST:  10mL GADAVIST GADOBUTROL 1 MMOL/ML IV SOLN

[Series 3: FLAIR · sagittal · 5.0mm · 0.47mm/px · 2 of 25 slices shown (1 of 2)]
[im 1/25]
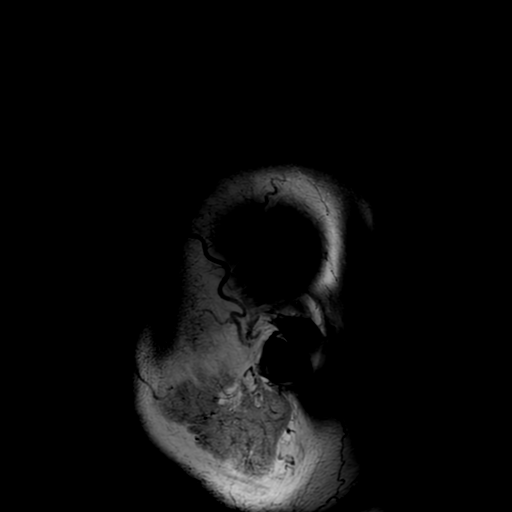
[im 25/25]
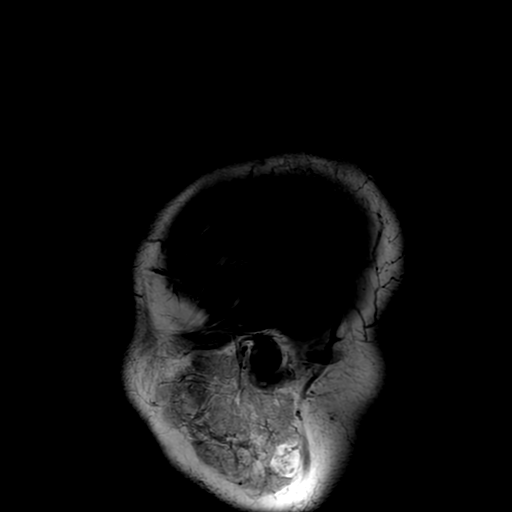

[Series 4: DWI · axial · 3.0mm · 0.94mm/px · z∈[-24,+121]mm · 6 of 98 slices shown]
[im 1/98]
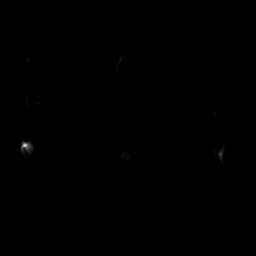
[im 20/98]
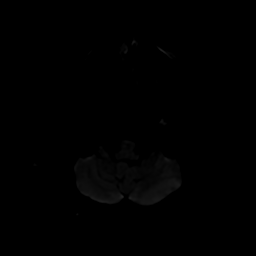
[im 39/98]
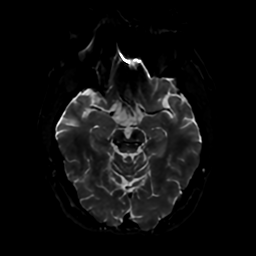
[im 59/98]
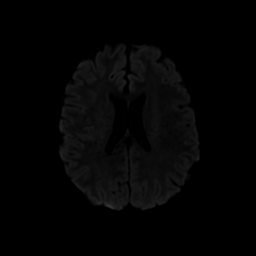
[im 78/98]
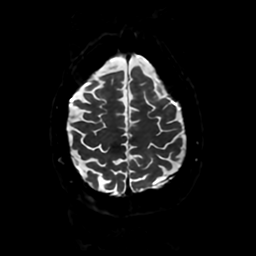
[im 98/98]
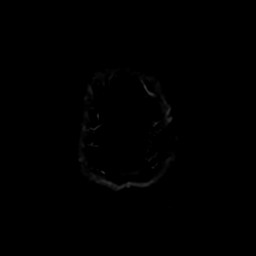

[Series 5: T2 · axial · 5.0mm · 0.47mm/px · z∈[-25,+123]mm · 2 of 26 slices shown]
[im 1/26]
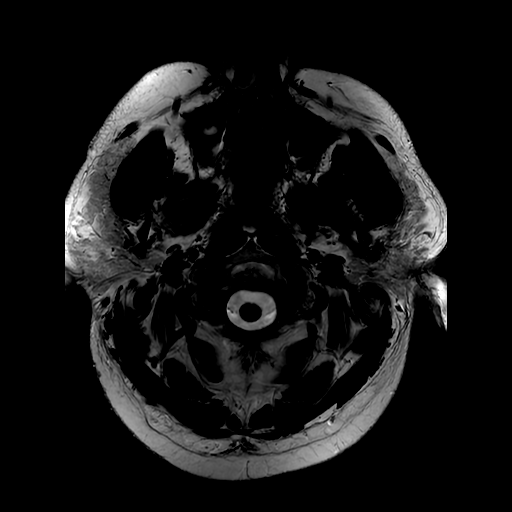
[im 26/26]
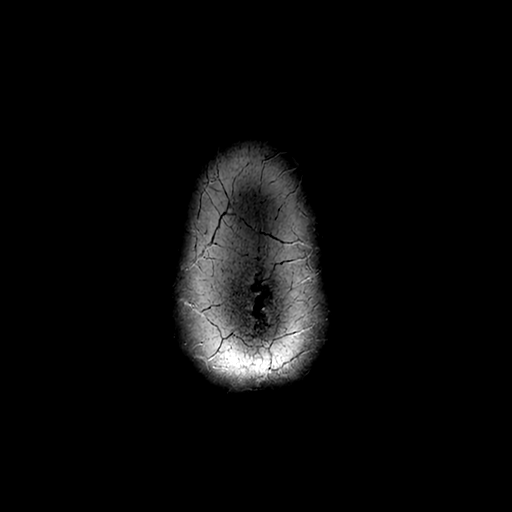

[Series 8: FLAIR · axial · 3.0mm · 0.43mm/px · z∈[-17,+119]mm · 2 of 24 slices shown (2 of 2)]
[im 1/24]
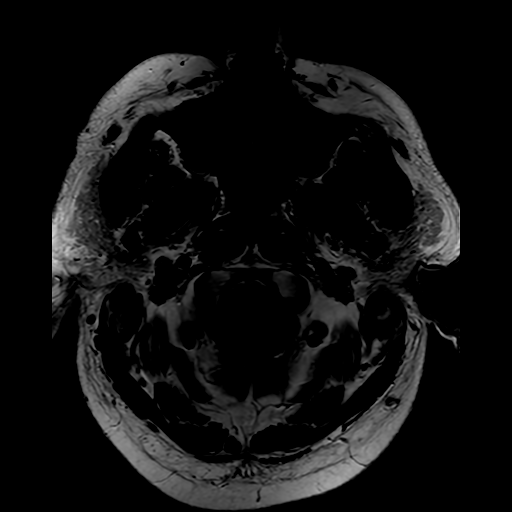
[im 24/24]
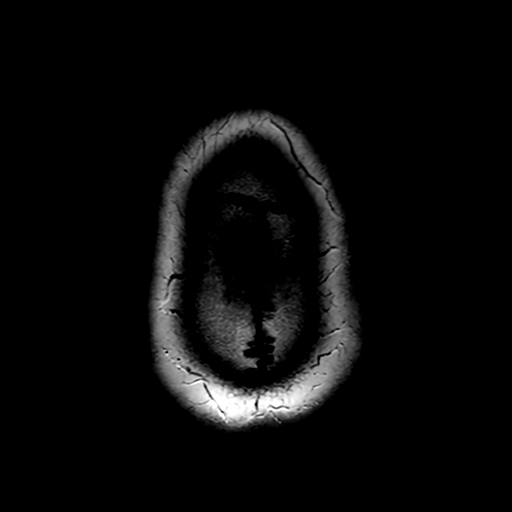

[Series 9: T1 · axial · non-contrast · 3.0mm · 0.94mm/px · z∈[-25,+126]mm · 3 of 52 slices shown (1 of 5)]
[im 1/52]
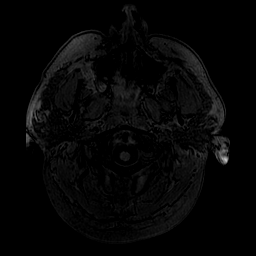
[im 26/52]
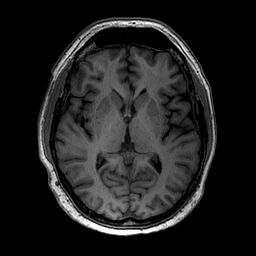
[im 52/52]
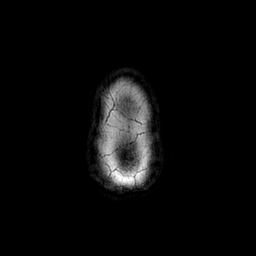

[Series 12: T1 · axial · 3.0mm · 0.35mm/px · 1 of 19 slices shown (2 of 5)]
[im 1/19]
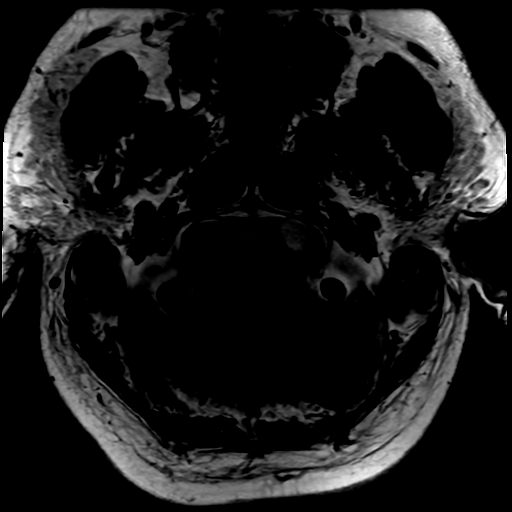

[Series 13: T1 · coronal · 3.0mm · 0.35mm/px · 1 of 19 slices shown (3 of 5)]
[im 1/19]
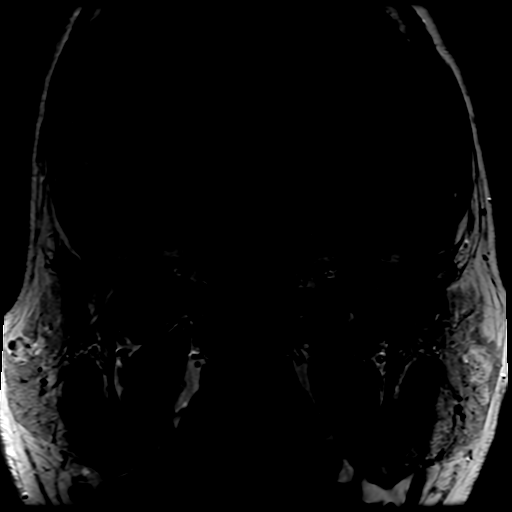

[Series 14: T2 post-contrast · coronal · 5.0mm · 0.39mm/px · 2 of 29 slices shown]
[im 1/29]
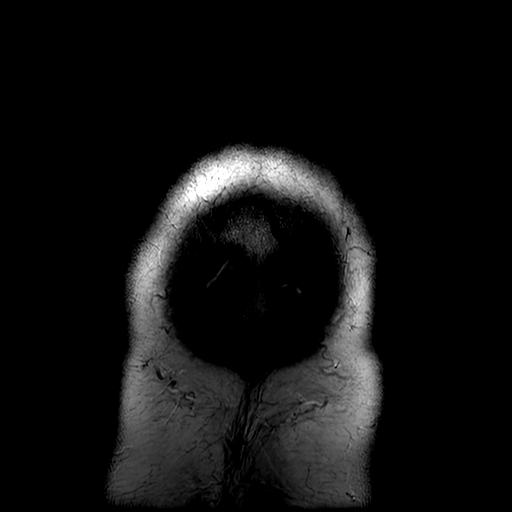
[im 29/29]
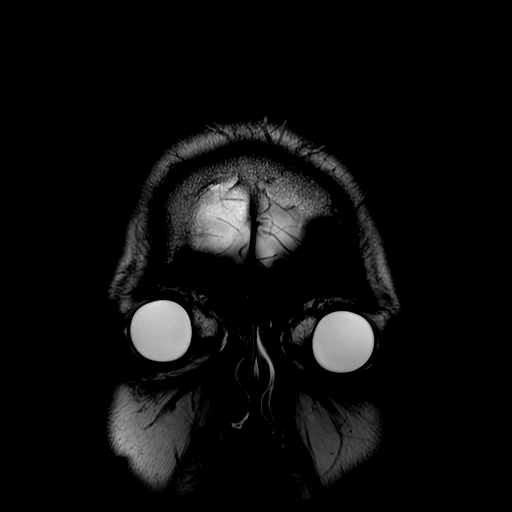

[Series 16: T1 post-contrast · axial · 3.0mm · 0.35mm/px · 1 of 19 slices shown (1 of 2)]
[im 1/19]
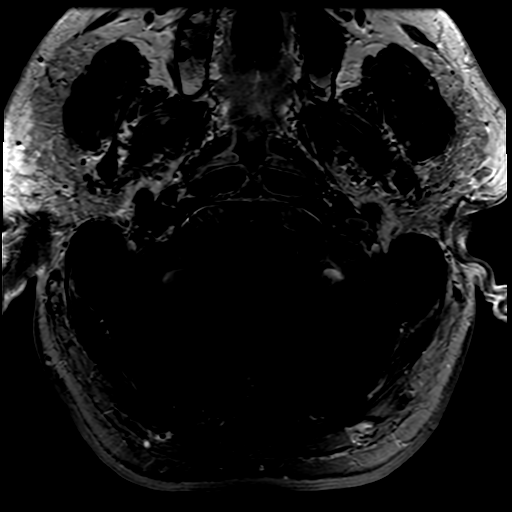

[Series 17: T1 · axial · 3.0mm · 0.94mm/px · z∈[-21,+118]mm · 3 of 48 slices shown (4 of 5)]
[im 1/48]
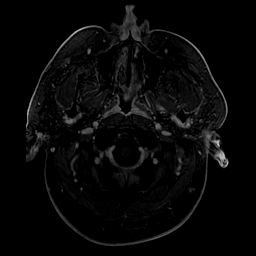
[im 24/48]
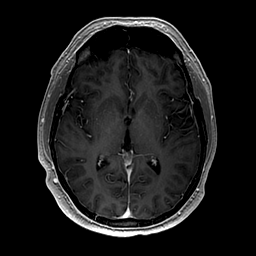
[im 48/48]
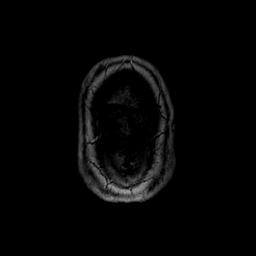

[Series 18: T1 post-contrast · coronal · 3.0mm · 0.35mm/px · 1 of 19 slices shown (2 of 2)]
[im 1/19]
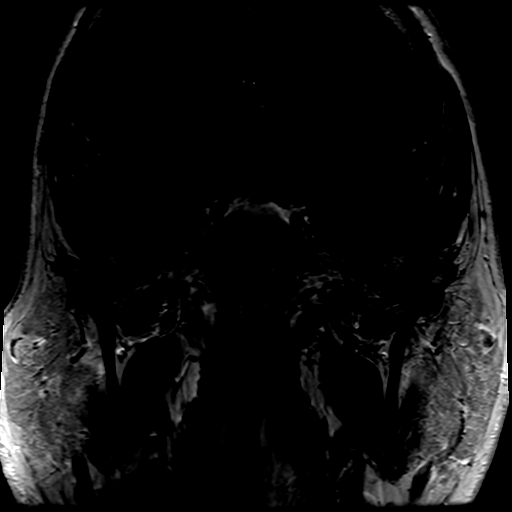

[Series 19: T1 · coronal · 5.0mm · 0.43mm/px · 2 of 30 slices shown (5 of 5)]
[im 1/30]
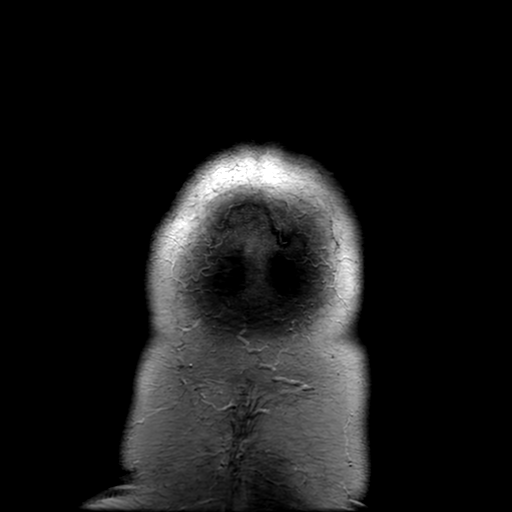
[im 30/30]
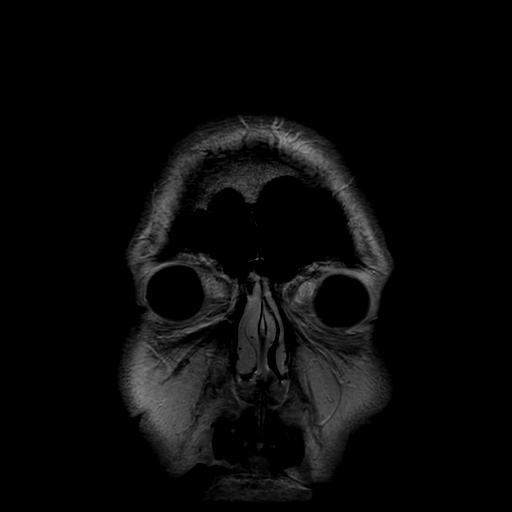

[Series 450: ADC · axial · 3.0mm · 0.94mm/px · z∈[-24,+121]mm · 3 of 50 slices shown]
[im 1/50]
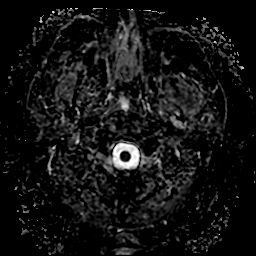
[im 25/50]
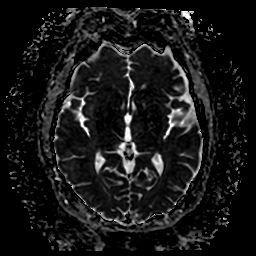
[im 50/50]
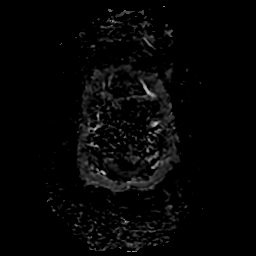

[29 of 48 positions shown; findings below may reference images not displayed]

FINDINGS: Brain:

Cerebral volume is normal.

No cortical encephalomalacia is identified.

No significant white matter disease.

No evidence of an intracranial mass. Specifically, no
cerebellopontine angle or internal auditory canal mass is
demonstrated. Unremarkable appearance of the seventh and eighth
cranial nerves bilaterally.

There is no acute infarct.

No chronic intracranial blood products.

No extra-axial fluid collection.

No midline shift.

No abnormal intracranial enhancement.

Vascular: Expected proximal arterial flow voids.

Skull and upper cervical spine: No focal marrow lesion.

Sinuses/Orbits: Visualized orbits show no acute finding. Mild
bilateral ethmoid and maxillary sinus mucosal thickening.
IMPRESSION: Unremarkable MRI appearance of the brain.

No evidence of acute intracranial abnormality.

No cerebellopontine angle or internal auditory canal mass.

Mild bilateral ethmoid and maxillary sinus mucosal thickening.

## 2022-02-26 DIAGNOSIS — F331 Major depressive disorder, recurrent, moderate: Secondary | ICD-10-CM | POA: Diagnosis not present

## 2022-02-26 DIAGNOSIS — F411 Generalized anxiety disorder: Secondary | ICD-10-CM | POA: Diagnosis not present

## 2022-02-26 DIAGNOSIS — G4700 Insomnia, unspecified: Secondary | ICD-10-CM | POA: Diagnosis not present

## 2022-02-26 DIAGNOSIS — I10 Essential (primary) hypertension: Secondary | ICD-10-CM | POA: Diagnosis not present

## 2022-04-20 DIAGNOSIS — H9121 Sudden idiopathic hearing loss, right ear: Secondary | ICD-10-CM | POA: Diagnosis not present

## 2022-04-20 DIAGNOSIS — H903 Sensorineural hearing loss, bilateral: Secondary | ICD-10-CM | POA: Diagnosis not present

## 2022-04-20 DIAGNOSIS — H9311 Tinnitus, right ear: Secondary | ICD-10-CM | POA: Diagnosis not present

## 2022-05-07 ENCOUNTER — Emergency Department (HOSPITAL_COMMUNITY): Payer: BC Managed Care – PPO

## 2022-05-07 ENCOUNTER — Encounter (HOSPITAL_COMMUNITY): Payer: Self-pay

## 2022-05-07 ENCOUNTER — Other Ambulatory Visit: Payer: Self-pay

## 2022-05-07 ENCOUNTER — Emergency Department (HOSPITAL_COMMUNITY)
Admission: EM | Admit: 2022-05-07 | Discharge: 2022-05-07 | Disposition: A | Discharge status: Home / Self Care | Payer: BC Managed Care – PPO | Attending: Emergency Medicine | Admitting: Emergency Medicine

## 2022-05-07 DIAGNOSIS — H1131 Conjunctival hemorrhage, right eye: Secondary | ICD-10-CM | POA: Insufficient documentation

## 2022-05-07 DIAGNOSIS — S0292XA Unspecified fracture of facial bones, initial encounter for closed fracture: Secondary | ICD-10-CM

## 2022-05-07 DIAGNOSIS — S0240CA Maxillary fracture, right side, initial encounter for closed fracture: Secondary | ICD-10-CM | POA: Insufficient documentation

## 2022-05-07 DIAGNOSIS — S199XXA Unspecified injury of neck, initial encounter: Secondary | ICD-10-CM | POA: Diagnosis not present

## 2022-05-07 DIAGNOSIS — Z79899 Other long term (current) drug therapy: Secondary | ICD-10-CM | POA: Diagnosis not present

## 2022-05-07 DIAGNOSIS — S80212A Abrasion, left knee, initial encounter: Secondary | ICD-10-CM | POA: Diagnosis not present

## 2022-05-07 DIAGNOSIS — Z23 Encounter for immunization: Secondary | ICD-10-CM | POA: Insufficient documentation

## 2022-05-07 DIAGNOSIS — S0011XA Contusion of right eyelid and periocular area, initial encounter: Secondary | ICD-10-CM | POA: Insufficient documentation

## 2022-05-07 DIAGNOSIS — M25561 Pain in right knee: Secondary | ICD-10-CM | POA: Insufficient documentation

## 2022-05-07 DIAGNOSIS — W1839XA Other fall on same level, initial encounter: Secondary | ICD-10-CM | POA: Insufficient documentation

## 2022-05-07 DIAGNOSIS — Y9302 Activity, running: Secondary | ICD-10-CM | POA: Diagnosis not present

## 2022-05-07 DIAGNOSIS — S0993XA Unspecified injury of face, initial encounter: Secondary | ICD-10-CM | POA: Diagnosis not present

## 2022-05-07 DIAGNOSIS — M542 Cervicalgia: Secondary | ICD-10-CM | POA: Diagnosis not present

## 2022-05-07 MED ORDER — AMOXICILLIN-POT CLAVULANATE 875-125 MG PO TABS: 1.0000 | ORAL_TABLET | Freq: Two times a day (BID) | ORAL | 0 refills | Status: DC

## 2022-05-07 MED ORDER — TETANUS-DIPHTHERIA TOXOIDS TD 5-2 LFU IM INJ
0.5000 mL | INJECTION | Freq: Once | INTRAMUSCULAR | Status: AC
  Administered 2022-05-07: 0.5 mL via INTRAMUSCULAR
  Filled 2022-05-07: qty 0.5

## 2022-05-07 NOTE — ED Triage Notes (Signed)
Pt reports with right eye injury after falling while running with his 2 large dogs. Pt passed out for 30 seconds and has some nausea/vomiting and headache. Pt's right eye is swollen shut. Pt has a large scrape to his left knee.

## 2022-05-07 NOTE — ED Provider Triage Note (Signed)
Emergency Medicine Provider Triage Evaluation Note  Sean Torres , a 46 y.o. male  was evaluated in triage.  Pt complains of fall.  Patient states that prior to arrival he was walking into dementia.  When they pulled him forward causing him to land on his face.  The patient wife states that the patient lost consciousness for 30 seconds.  Patient also had an episode of nausea and vomiting on the way to the ED that he does not remember happening.  Patient alert and oriented on examination.  Review of Systems  Positive:  Negative:   Physical Exam  BP (!) 130/95 (BP Location: Left Arm)   Pulse 98   Temp 99.1 F (37.3 C) (Oral)   Resp 17   Ht 6' (1.829 m)   Wt 84 kg   SpO2 93%   BMI 25.10 kg/m  Gen:   Awake, no distress   Resp:  Normal effort  MSK:   Moves extremities without difficulty  Other:  Patient right eye swollen shut.  Unable to assess EOMs and right eye.  Patient has superficial abrasion to left knee.    Medical Decision Making  Medically screening exam initiated at 8:31 PM.  Appropriate orders placed.  Kameran Mcneese was informed that the remainder of the evaluation will be completed by another provider, this initial triage assessment does not replace that evaluation, and the importance of remaining in the ED until their evaluation is complete.     Al Decant, PA-C 05/07/22 2033

## 2022-05-07 NOTE — ED Provider Notes (Signed)
[facility] [place] [facility]-EMERGENCY DEPT Provider Note   CSN: [id number] Arrival date & time: 05/07/22  1956     History  Chief Complaint  Patient presents with   Facial Injury   Fall    Sean Torres is a 46 y.o. male.  46 year old male presents after mechanical fall just prior to arrival.  [place] onto the right side of his face.  Positive LOC.  Is back to his baseline.  Complains of pain to his right eye socket, left knee.  He also notes some midline neck tenderness without radicular symptoms.  No chest or abdominal comfort.  No weakness in his lower extremities.  His tetanus status is not up-to-date       Home Medications Prior to Admission medications   Medication Sig Start Date End Date Taking? Authorizing Provider  ALPRAZolam (XANAX) 0.25 MG tablet Take 0.25-0.5 mg by mouth daily as needed for anxiety. 09/27/21   [provider]  amLODipine (NORVASC) 5 MG tablet Take 5 mg by mouth daily. 09/26/21   [provider]  buPROPion (WELLBUTRIN XL) 150 MG 24 hr tablet Take 150 mg by mouth every morning. 07/18/21   [provider]  desonide (DESOWEN) 0.05 % lotion Apply 1 application topically daily as needed (facial skin patch). 12/12/20   [provider]  pantoprazole (PROTONIX) 40 MG tablet Take 40 mg by mouth 2 (two) times daily.    [provider]  potassium chloride SA (KLOR-CON M) 20 MEQ tablet Take 1 tablet (20 mEq total) by mouth daily for 7 days. 10/29/21 11/05/21  Zigmund Daniel., MD  sertraline (ZOLOFT) 50 MG tablet Take 50 mg by mouth in the morning. 12/19/20   [provider]  zolpidem (AMBIEN CR) 6.25 MG CR tablet Take 6.25 mg by mouth at bedtime. 09/26/21   [provider]      Allergies    Erythromycin base    Review of Systems   Review of Systems  All other systems reviewed and are negative.   Physical Exam Updated Vital Signs BP (!) 130/95 (BP Location: Left Arm)   Pulse 98   Temp 99.1 F  (37.3 C) (Oral)   Resp 17   Ht 1.829 m (6')   Wt 84 kg   SpO2 93%   BMI 25.10 kg/m  Physical Exam Vitals and nursing note reviewed.  Constitutional:      General: He is not in acute distress.    Appearance: Normal appearance. He is well-developed. He is not toxic-appearing.  HENT:     Head:   Eyes:     General: Lids are normal.     Conjunctiva/sclera: Conjunctivae normal.     Pupils: Pupils are equal, round, and reactive to light.  Neck:     Thyroid: No thyroid mass.     Trachea: No tracheal deviation.  Cardiovascular:     Rate and Rhythm: Normal rate and regular rhythm.     Heart sounds: Normal heart sounds. No murmur heard.    No gallop.  Pulmonary:     Effort: Pulmonary effort is normal. No respiratory distress.     Breath sounds: Normal breath sounds. No stridor. No decreased breath sounds, wheezing, rhonchi or rales.  Abdominal:     General: There is no distension.     Palpations: Abdomen is soft.     Tenderness: There is no abdominal tenderness. There is no rebound.  Musculoskeletal:        General: No tenderness. Normal range of  motion.     Cervical back: Normal range of motion and neck supple.       Legs:     Comments: Full range of motion of patient's left and right knee.  No deformity noted.  Abrasions noted.  Skin:    General: Skin is warm and dry.     Findings: No abrasion or rash.  Neurological:     General: No focal deficit present.     Mental Status: He is alert and oriented to person, place, and time. Mental status is at baseline.     GCS: GCS eye subscore is 4. GCS verbal subscore is 5. GCS motor subscore is 6.     Cranial Nerves: No cranial nerve deficit.     Sensory: No sensory deficit.     Motor: Motor function is intact.  Psychiatric:        Attention and Perception: Attention normal.        Speech: Speech normal.        Behavior: Behavior normal.     ED Results / Procedures / Treatments   Labs (all labs ordered are listed, but only  abnormal results are displayed) Labs Reviewed - No data to display  EKG None  Radiology CT Head Wo Contrast  Result Date: 05/07/2022 CLINICAL DATA:  Recent fall while running with right-sided facial trauma, initial encounter EXAM: CT HEAD WITHOUT CONTRAST CT MAXILLOFACIAL WITHOUT CONTRAST TECHNIQUE: Multidetector CT imaging of the head and maxillofacial structures were performed using the standard protocol without intravenous contrast. Multiplanar CT image reconstructions of the maxillofacial structures were also generated. RADIATION DOSE REDUCTION: This exam was performed according to the departmental dose-optimization program which includes automated exposure control, adjustment of the mA and/or kV according to patient size and/or use of iterative reconstruction technique. COMPARISON:  01/28/2021 FINDINGS: CT HEAD FINDINGS Brain: No evidence of acute infarction, hemorrhage, hydrocephalus, extra-axial collection or mass lesion/mass effect. Vascular: No hyperdense vessel or unexpected calcification. Skull: Normal. Negative for fracture or focal lesion. Other: Considerable right periorbital soft tissue swelling is noted consistent with the recent injury. Underlying right globe is within normal limits. CT MAXILLOFACIAL FINDINGS Osseous: Depressed fracture of the anterior wall of the right maxillary antrum is noted. This also involves the lateral wall. No other fractures are seen. Orbits: Orbits and their contents are within normal limits. Sinuses: Paranasal sinuses show no air-fluid level within the right maxillary antrum related to the known fractures. Soft tissues: Considerable right periorbital soft tissue swelling is noted consistent with the recent injury. No other significant soft tissue abnormality is noted. IMPRESSION: CT of the head: No acute intracranial abnormality noted. CT of the maxillofacial bones: Fractures involving the anterior and lateral walls of the right maxillary antrum with associated  air-fluid level. Stable right periorbital soft tissue swelling. No right globe abnormality is noted. Electronically Signed   By: Alcide Clever M.D.   On: 05/07/2022 21:13   CT Maxillofacial Wo Contrast  Result Date: 05/07/2022 CLINICAL DATA:  Recent fall while running with right-sided facial trauma, initial encounter EXAM: CT HEAD WITHOUT CONTRAST CT MAXILLOFACIAL WITHOUT CONTRAST TECHNIQUE: Multidetector CT imaging of the head and maxillofacial structures were performed using the standard protocol without intravenous contrast. Multiplanar CT image reconstructions of the maxillofacial structures were also generated. RADIATION DOSE REDUCTION: This exam was performed according to the departmental dose-optimization program which includes automated exposure control, adjustment of the mA and/or kV according to patient size and/or use of iterative reconstruction technique. COMPARISON:  01/28/2021 FINDINGS: CT  HEAD FINDINGS Brain: No evidence of acute infarction, hemorrhage, hydrocephalus, extra-axial collection or mass lesion/mass effect. Vascular: No hyperdense vessel or unexpected calcification. Skull: Normal. Negative for fracture or focal lesion. Other: Considerable right periorbital soft tissue swelling is noted consistent with the recent injury. Underlying right globe is within normal limits. CT MAXILLOFACIAL FINDINGS Osseous: Depressed fracture of the anterior wall of the right maxillary antrum is noted. This also involves the lateral wall. No other fractures are seen. Orbits: Orbits and their contents are within normal limits. Sinuses: Paranasal sinuses show no air-fluid level within the right maxillary antrum related to the known fractures. Soft tissues: Considerable right periorbital soft tissue swelling is noted consistent with the recent injury. No other significant soft tissue abnormality is noted. IMPRESSION: CT of the head: No acute intracranial abnormality noted. CT of the maxillofacial bones: Fractures  involving the anterior and lateral walls of the right maxillary antrum with associated air-fluid level. Stable right periorbital soft tissue swelling. No right globe abnormality is noted. Electronically Signed   By: Alcide Clever M.D.   On: 05/07/2022 21:13    Procedures Procedures    Medications Ordered in ED Medications  tetanus & diphtheria toxoids (adult) (TENIVAC) injection 0.5 mL (has no administration in time range)    ED Course/ Medical Decision Making/ A&P                           Medical Decision Making Amount and/or Complexity of Data Reviewed Radiology: ordered.  Risk Prescription drug management.   CT cervical spine without acute injury per my interpretation.  CT of brain and facial shows fractures in following the right maxillary antrum with air-fluid level.  This is per my review and interpretation.  Patient with multiple abrasions which were addressed.  Patient's tetanus status was updated.  Patient has no signs of significant ocular trauma other than some subconjunctival hemorrhage.  Given referral to maxillofacial surgeon on-call.        Final Clinical Impression(s) / ED Diagnoses Final diagnoses:  None    Rx / DC Orders ED Discharge Orders     None         Lorre Nick, MD 05/07/22 2243

## 2022-05-10 DIAGNOSIS — S01111A Laceration without foreign body of right eyelid and periocular area, initial encounter: Secondary | ICD-10-CM | POA: Diagnosis not present

## 2022-05-10 DIAGNOSIS — S0240AA Malar fracture, right side, initial encounter for closed fracture: Secondary | ICD-10-CM | POA: Diagnosis not present

## 2022-05-10 DIAGNOSIS — H1131 Conjunctival hemorrhage, right eye: Secondary | ICD-10-CM | POA: Diagnosis not present

## 2022-05-10 DIAGNOSIS — S0511XA Contusion of eyeball and orbital tissues, right eye, initial encounter: Secondary | ICD-10-CM | POA: Diagnosis not present

## 2022-05-28 DIAGNOSIS — S0285XD Fracture of orbit, unspecified, subsequent encounter for fracture with routine healing: Secondary | ICD-10-CM | POA: Diagnosis not present

## 2022-11-20 ENCOUNTER — Emergency Department (HOSPITAL_BASED_OUTPATIENT_CLINIC_OR_DEPARTMENT_OTHER): Payer: BC Managed Care – PPO

## 2022-11-20 ENCOUNTER — Other Ambulatory Visit: Payer: Self-pay

## 2022-11-20 ENCOUNTER — Emergency Department (HOSPITAL_BASED_OUTPATIENT_CLINIC_OR_DEPARTMENT_OTHER)
Admission: EM | Admit: 2022-11-20 | Discharge: 2022-11-20 | Disposition: A | Discharge status: Home / Self Care | Payer: BC Managed Care – PPO | Attending: Emergency Medicine | Admitting: Emergency Medicine

## 2022-11-20 ENCOUNTER — Encounter (HOSPITAL_BASED_OUTPATIENT_CLINIC_OR_DEPARTMENT_OTHER): Payer: Self-pay

## 2022-11-20 DIAGNOSIS — M25512 Pain in left shoulder: Secondary | ICD-10-CM

## 2022-11-20 DIAGNOSIS — S4992XA Unspecified injury of left shoulder and upper arm, initial encounter: Secondary | ICD-10-CM | POA: Diagnosis not present

## 2022-11-20 DIAGNOSIS — W132XXA Fall from, out of or through roof, initial encounter: Secondary | ICD-10-CM

## 2022-11-20 DIAGNOSIS — S40212A Abrasion of left shoulder, initial encounter: Secondary | ICD-10-CM | POA: Diagnosis not present

## 2022-11-20 DIAGNOSIS — M79632 Pain in left forearm: Secondary | ICD-10-CM | POA: Diagnosis not present

## 2022-11-20 DIAGNOSIS — M25532 Pain in left wrist: Secondary | ICD-10-CM | POA: Diagnosis not present

## 2022-11-20 NOTE — ED Triage Notes (Addendum)
Pt to ED C/O left arm injury. Pt reports was about 9 ft up on a ladder and fell, occurred aprox 2 hours ;obvious deformity noted to left wrist area. Pt able to move fingers

## 2022-11-20 NOTE — ED Provider Notes (Signed)
Sean Torres Provider Note   CSN: [id number] Arrival date & time: 11/20/22  1119     History  Chief Complaint  Patient presents with   Arm Injury    left    Sean Torres is a 47 y.o. male.  47 yo M with a chief complaints of a fall.  The patient says he was on a roof he thinks was about 8 to 10 feet and elevation and he lost his balance and fell and landed on his left arm.  Has been complaining of left forearm pain.  He denies significant head injury denies chest pain denies abdominal pain denies neck pain denies back pain denies other extremity pain.  Tetanus is up-to-date.   Arm Injury      Home Medications Prior to Admission medications   Medication Sig Start Date End Date Taking? Authorizing Provider  ALPRAZolam (XANAX) 0.25 MG tablet Take 0.25-0.5 mg by mouth daily as needed for anxiety. 09/27/21   [provider]  amLODipine (NORVASC) 5 MG tablet Take 5 mg by mouth daily. 09/26/21   [provider]  amoxicillin-clavulanate (AUGMENTIN) 875-125 MG tablet Take 1 tablet by mouth every 12 (twelve) hours. 05/07/22   Lacretia Leigh, MD  buPROPion (WELLBUTRIN XL) 150 MG 24 hr tablet Take 150 mg by mouth every morning. 07/18/21   [provider]  desonide (DESOWEN) 0.05 % lotion Apply 1 application topically daily as needed (facial skin patch). 12/12/20   [provider]  pantoprazole (PROTONIX) 40 MG tablet Take 40 mg by mouth 2 (two) times daily.    [provider]  potassium chloride SA (KLOR-CON M) 20 MEQ tablet Take 1 tablet (20 mEq total) by mouth daily for 7 days. 10/29/21 11/05/21  Sean Torres., MD  sertraline (ZOLOFT) 50 MG tablet Take 50 mg by mouth in the morning. 12/19/20   [provider]  zolpidem (AMBIEN CR) 6.25 MG CR tablet Take 6.25 mg by mouth at bedtime. 09/26/21   [provider]      Allergies    Erythromycin base    Review of Systems   Review of  Systems  Physical Exam Updated Vital Signs BP (!) 142/110 (BP Location: Right Arm)   Pulse (!) 101   Temp 97.7 F (36.5 C) (Oral)   Resp 18   Ht 5\' 11"  (1.803 m)   Wt 83.5 kg   SpO2 94%   BMI 25.66 kg/m  Physical Exam Vitals and nursing note reviewed.  Constitutional:      Appearance: He is well-developed.  HENT:     Head: Normocephalic and atraumatic.  Eyes:     Pupils: Pupils are equal, round, and reactive to light.  Neck:     Vascular: No JVD.  Cardiovascular:     Rate and Rhythm: Normal rate and regular rhythm.     Heart sounds: No murmur heard.    No friction rub. No gallop.  Pulmonary:     Effort: No respiratory distress.     Breath sounds: No wheezing.  Abdominal:     General: There is no distension.     Tenderness: There is no abdominal tenderness. There is no guarding or rebound.  Musculoskeletal:        General: Swelling and tenderness present. Normal range of motion.     Cervical back: Normal range of motion and neck supple.     Comments: Pulse motor and sensation intact distally.  2 small abrasions over the dorsal aspect of  the skin.  Skin:    Coloration: Skin is not pale.     Findings: No rash.  Neurological:     Mental Status: He is alert and oriented to person, place, and time.  Psychiatric:        Behavior: Behavior normal.     ED Results / Procedures / Treatments   Labs (all labs ordered are listed, but only abnormal results are displayed) Labs Reviewed - No data to display  EKG None  Radiology DG Wrist Complete Left  Result Date: 11/20/2022 CLINICAL DATA:  Left wrist pain after fall. EXAM: LEFT WRIST - COMPLETE 3+ VIEW COMPARISON:  None Available. FINDINGS: There is no evidence of fracture or dislocation. There is no evidence of arthropathy or other focal bone abnormality. Soft tissues are unremarkable. IMPRESSION: 1. No acute abnormality. Electronically Signed   By: Kerby Moors M.D.   On: 11/20/2022 12:35    Procedures Procedures     Medications Ordered in ED Medications - No data to display  ED Course/ Medical Decision Making/ A&P                             Medical Decision Making Amount and/or Complexity of Data Reviewed Radiology: ordered.   47 yo M with a chief complaints of fall off of a roof.  Estimated to be between 8 and 10 feet high.  Complaining only of left forearm pain.  Patient has some swelling to the area.  Will obtain a plain film.  Reassess.  Plain film independently interpreted by me without fracture or dislocation.  Offered removable splint which she is declining.  PCP follow-up.  3:23 PM:  I have discussed the diagnosis/risks/treatment options with the patient.  Evaluation and diagnostic testing in the emergency department does not suggest an emergent condition requiring admission or immediate intervention beyond what has been performed at this time.  They will follow up with PCP. We also discussed returning to the ED immediately if new or worsening sx occur. We discussed the sx which are most concerning (e.g., sudden worsening pain, fever, inability to tolerate by mouth) that necessitate immediate return. Medications administered to the patient during their visit and any new prescriptions provided to the patient are listed below.  Medications given during this visit Medications - No data to display   The patient appears reasonably screen and/or stabilized for discharge and I doubt any other medical condition or other [facility] requiring further screening, evaluation, or treatment in the ED at this time prior to discharge.          Final Clinical Impression(s) / ED Diagnoses Final diagnoses:  Left forearm pain  Acute pain of left shoulder  Fall from roof as cause of accidental injury    Rx / DC Orders ED Discharge Orders     None         [name], DO 11/20/22 1523

## 2022-11-20 NOTE — Discharge Instructions (Signed)
Please follow-up with your family doctor in the office.  Take 4 over the counter ibuprofen tablets 3 times a day or 2 over-the-counter naproxen tablets twice a day for pain. Also take tylenol 1000mg (2 extra strength) four times a day.

## 2023-04-27 ENCOUNTER — Inpatient Hospital Stay (HOSPITAL_BASED_OUTPATIENT_CLINIC_OR_DEPARTMENT_OTHER)
Admission: EM | Admit: 2023-04-27 | Discharge: 2023-04-29 | DRG: 897 | Disposition: A | Discharge status: Home / Self Care | Payer: BC Managed Care – PPO | Attending: Internal Medicine | Admitting: Internal Medicine

## 2023-04-27 ENCOUNTER — Encounter (HOSPITAL_COMMUNITY): Payer: Self-pay | Admitting: Internal Medicine

## 2023-04-27 ENCOUNTER — Emergency Department (HOSPITAL_BASED_OUTPATIENT_CLINIC_OR_DEPARTMENT_OTHER): Payer: BC Managed Care – PPO

## 2023-04-27 ENCOUNTER — Other Ambulatory Visit: Payer: Self-pay

## 2023-04-27 DIAGNOSIS — Z833 Family history of diabetes mellitus: Secondary | ICD-10-CM | POA: Diagnosis not present

## 2023-04-27 DIAGNOSIS — S0990XA Unspecified injury of head, initial encounter: Secondary | ICD-10-CM | POA: Diagnosis not present

## 2023-04-27 DIAGNOSIS — F32A Depression, unspecified: Secondary | ICD-10-CM | POA: Diagnosis present

## 2023-04-27 DIAGNOSIS — R7401 Elevation of levels of liver transaminase levels: Secondary | ICD-10-CM | POA: Diagnosis not present

## 2023-04-27 DIAGNOSIS — I444 Left anterior fascicular block: Secondary | ICD-10-CM | POA: Diagnosis present

## 2023-04-27 DIAGNOSIS — F10231 Alcohol dependence with withdrawal delirium: Principal | ICD-10-CM | POA: Diagnosis present

## 2023-04-27 DIAGNOSIS — K649 Unspecified hemorrhoids: Secondary | ICD-10-CM | POA: Diagnosis not present

## 2023-04-27 DIAGNOSIS — R2990 Unspecified symptoms and signs involving the nervous system: Secondary | ICD-10-CM | POA: Diagnosis not present

## 2023-04-27 DIAGNOSIS — R479 Unspecified speech disturbances: Secondary | ICD-10-CM | POA: Diagnosis not present

## 2023-04-27 DIAGNOSIS — K760 Fatty (change of) liver, not elsewhere classified: Secondary | ICD-10-CM | POA: Diagnosis present

## 2023-04-27 DIAGNOSIS — Y908 Blood alcohol level of 240 mg/100 ml or more: Secondary | ICD-10-CM | POA: Diagnosis present

## 2023-04-27 DIAGNOSIS — Z83719 Family history of colon polyps, unspecified: Secondary | ICD-10-CM

## 2023-04-27 DIAGNOSIS — H9192 Unspecified hearing loss, left ear: Secondary | ICD-10-CM | POA: Diagnosis not present

## 2023-04-27 DIAGNOSIS — K625 Hemorrhage of anus and rectum: Secondary | ICD-10-CM | POA: Diagnosis not present

## 2023-04-27 DIAGNOSIS — D696 Thrombocytopenia, unspecified: Secondary | ICD-10-CM | POA: Diagnosis present

## 2023-04-27 DIAGNOSIS — Z808 Family history of malignant neoplasm of other organs or systems: Secondary | ICD-10-CM

## 2023-04-27 DIAGNOSIS — F419 Anxiety disorder, unspecified: Secondary | ICD-10-CM | POA: Diagnosis not present

## 2023-04-27 DIAGNOSIS — Z800 Family history of malignant neoplasm of digestive organs: Secondary | ICD-10-CM

## 2023-04-27 DIAGNOSIS — I10 Essential (primary) hypertension: Secondary | ICD-10-CM | POA: Diagnosis present

## 2023-04-27 DIAGNOSIS — G4733 Obstructive sleep apnea (adult) (pediatric): Secondary | ICD-10-CM | POA: Diagnosis not present

## 2023-04-27 DIAGNOSIS — R4701 Aphasia: Secondary | ICD-10-CM | POA: Diagnosis present

## 2023-04-27 DIAGNOSIS — R569 Unspecified convulsions: Secondary | ICD-10-CM | POA: Diagnosis not present

## 2023-04-27 DIAGNOSIS — E785 Hyperlipidemia, unspecified: Secondary | ICD-10-CM | POA: Diagnosis present

## 2023-04-27 DIAGNOSIS — Z881 Allergy status to other antibiotic agents status: Secondary | ICD-10-CM | POA: Diagnosis not present

## 2023-04-27 DIAGNOSIS — Z6825 Body mass index (BMI) 25.0-25.9, adult: Secondary | ICD-10-CM | POA: Diagnosis not present

## 2023-04-27 DIAGNOSIS — Z87891 Personal history of nicotine dependence: Secondary | ICD-10-CM

## 2023-04-27 DIAGNOSIS — F1020 Alcohol dependence, uncomplicated: Secondary | ICD-10-CM | POA: Diagnosis not present

## 2023-04-27 DIAGNOSIS — K219 Gastro-esophageal reflux disease without esophagitis: Secondary | ICD-10-CM | POA: Diagnosis present

## 2023-04-27 DIAGNOSIS — Z8249 Family history of ischemic heart disease and other diseases of the circulatory system: Secondary | ICD-10-CM

## 2023-04-27 DIAGNOSIS — G459 Transient cerebral ischemic attack, unspecified: Secondary | ICD-10-CM | POA: Diagnosis not present

## 2023-04-27 DIAGNOSIS — D6959 Other secondary thrombocytopenia: Secondary | ICD-10-CM | POA: Diagnosis not present

## 2023-04-27 DIAGNOSIS — I5031 Acute diastolic (congestive) heart failure: Secondary | ICD-10-CM | POA: Diagnosis not present

## 2023-04-27 DIAGNOSIS — R079 Chest pain, unspecified: Secondary | ICD-10-CM | POA: Diagnosis not present

## 2023-04-27 DIAGNOSIS — N200 Calculus of kidney: Secondary | ICD-10-CM | POA: Diagnosis not present

## 2023-04-27 DIAGNOSIS — F1010 Alcohol abuse, uncomplicated: Secondary | ICD-10-CM | POA: Diagnosis not present

## 2023-04-27 DIAGNOSIS — E669 Obesity, unspecified: Secondary | ICD-10-CM | POA: Diagnosis not present

## 2023-04-27 DIAGNOSIS — Z79899 Other long term (current) drug therapy: Secondary | ICD-10-CM

## 2023-04-27 DIAGNOSIS — R29818 Other symptoms and signs involving the nervous system: Secondary | ICD-10-CM | POA: Diagnosis not present

## 2023-04-27 DIAGNOSIS — R000 Tachycardia, unspecified: Secondary | ICD-10-CM | POA: Diagnosis not present

## 2023-04-27 LAB — TROPONIN I (HIGH SENSITIVITY)
Troponin I (High Sensitivity): 7 ng/L (ref <18)
Troponin I (High Sensitivity): 6 ng/L (ref <18)

## 2023-04-27 LAB — RETICULOCYTES
Immature Retic Fract: 2.9 % (ref 2.3–15.9)
RBC.: 3.51 MIL/uL — ABNORMAL LOW (ref 4.22–5.81)
Retic Count, Absolute: 38.6 10*3/uL (ref 19.0–186.0)
Retic Ct Pct: 1.1 % (ref 0.4–3.1)

## 2023-04-27 LAB — DIFFERENTIAL
Abs Immature Granulocytes: 0.01 10*3/uL (ref 0.00–0.07)
Basophils Absolute: 0 10*3/uL (ref 0.0–0.1)
Basophils Relative: 1 %
Eosinophils Absolute: 0.2 10*3/uL (ref 0.0–0.5)
Eosinophils Relative: 4 %
Immature Granulocytes: 0 %
Lymphocytes Relative: 31 %
Lymphs Abs: 1.6 10*3/uL (ref 0.7–4.0)
Monocytes Absolute: 0.6 10*3/uL (ref 0.1–1.0)
Monocytes Relative: 12 %
Neutro Abs: 2.6 10*3/uL (ref 1.7–7.7)
Neutrophils Relative %: 52 %
Smear Review: NORMAL

## 2023-04-27 LAB — COMPREHENSIVE METABOLIC PANEL
ALT: 102 U/L — ABNORMAL HIGH (ref 0–44)
AST: 112 U/L — ABNORMAL HIGH (ref 15–41)
Albumin: 4.8 g/dL (ref 3.5–5.0)
Alkaline Phosphatase: 72 U/L (ref 38–126)
Anion gap: 17 — ABNORMAL HIGH (ref 5–15)
BUN: 9 mg/dL (ref 6–20)
CO2: 25 mmol/L (ref 22–32)
Calcium: 9.2 mg/dL (ref 8.9–10.3)
Chloride: 98 mmol/L (ref 98–111)
Creatinine, Ser: 0.67 mg/dL (ref 0.61–1.24)
GFR, Estimated: >60 mL/min (ref >60)
Glucose, Bld: 75 mg/dL (ref 70–99)
Potassium: 3.8 mmol/L (ref 3.5–5.1)
Sodium: 140 mmol/L (ref 135–145)
Total Bilirubin: 0.6 mg/dL (ref 0.3–1.2)
Total Protein: 7.5 g/dL (ref 6.5–8.1)

## 2023-04-27 LAB — CBC
HCT: 39.1 % (ref 39.0–52.0)
Hemoglobin: 13.4 g/dL (ref 13.0–17.0)
MCH: 35.3 pg — ABNORMAL HIGH (ref 26.0–34.0)
MCHC: 34.3 g/dL (ref 30.0–36.0)
MCV: 102.9 fL — ABNORMAL HIGH (ref 80.0–100.0)
Platelets: 81 10*3/uL — ABNORMAL LOW (ref 150–400)
RBC: 3.8 MIL/uL — ABNORMAL LOW (ref 4.22–5.81)
RDW: 12.4 % (ref 11.5–15.5)
WBC: 5 10*3/uL (ref 4.0–10.5)
nRBC: 0 % (ref 0.0–0.2)

## 2023-04-27 LAB — PROTIME-INR
INR: 1 (ref 0.8–1.2)
Prothrombin Time: 13.8 seconds (ref 11.4–15.2)

## 2023-04-27 LAB — TSH: TSH: 2.307 u[IU]/mL (ref 0.350–4.500)

## 2023-04-27 LAB — MAGNESIUM: Magnesium: 2.1 mg/dL (ref 1.7–2.4)

## 2023-04-27 LAB — APTT: aPTT: 27 seconds (ref 24–36)

## 2023-04-27 LAB — IRON AND TIBC
Iron: 119 ug/dL (ref 45–182)
Saturation Ratios: 37 % (ref 17.9–39.5)
TIBC: 318 ug/dL (ref 250–450)
UIBC: 199 ug/dL

## 2023-04-27 LAB — VITAMIN B12: Vitamin B-12: 592 pg/mL (ref 180–914)

## 2023-04-27 LAB — HEPATITIS PANEL, ACUTE
HCV Ab: NONREACTIVE
Hep A IgM: NONREACTIVE
Hep B C IgM: NONREACTIVE
Hepatitis B Surface Ag: NONREACTIVE

## 2023-04-27 LAB — ETHANOL: Alcohol, Ethyl (B): 339 mg/dL (ref <10)

## 2023-04-27 LAB — AMMONIA: Ammonia: 19 umol/L (ref 9–35)

## 2023-04-27 LAB — FOLATE: Folate: >40 ng/mL (ref >5.9)

## 2023-04-27 LAB — FERRITIN: Ferritin: 620 ng/mL — ABNORMAL HIGH (ref 24–336)

## 2023-04-27 LAB — CBG MONITORING, ED
Glucose-Capillary: 62 mg/dL — ABNORMAL LOW (ref 70–99)
Glucose-Capillary: 120 mg/dL — ABNORMAL HIGH (ref 70–99)

## 2023-04-27 LAB — PHOSPHORUS: Phosphorus: 5.2 mg/dL — ABNORMAL HIGH (ref 2.5–4.6)

## 2023-04-27 LAB — HIV ANTIBODY (ROUTINE TESTING W REFLEX): HIV Screen 4th Generation wRfx: NONREACTIVE

## 2023-04-27 MED ORDER — BUPROPION HCL ER (XL) 150 MG PO TB24
150.0000 mg | ORAL_TABLET | Freq: Every morning | ORAL | Status: DC
  Administered 2023-04-28 – 2023-04-29 (×2): 150 mg via ORAL
  Filled 2023-04-27 (×2): qty 1

## 2023-04-27 MED ORDER — ACETAMINOPHEN 325 MG PO TABS: 650.0000 mg | ORAL_TABLET | Freq: Four times a day (QID) | ORAL | Status: DC | PRN

## 2023-04-27 MED ORDER — LORAZEPAM 2 MG/ML IJ SOLN
1.0000 mg | Freq: Once | INTRAMUSCULAR | Status: AC
  Administered 2023-04-27: 1 mg via INTRAVENOUS
  Filled 2023-04-27: qty 1

## 2023-04-27 MED ORDER — METOPROLOL TARTRATE 5 MG/5ML IV SOLN: 5.0000 mg | Freq: Four times a day (QID) | INTRAVENOUS | Status: DC | PRN

## 2023-04-27 MED ORDER — ONDANSETRON HCL 4 MG PO TABS: 4.0000 mg | ORAL_TABLET | Freq: Four times a day (QID) | ORAL | Status: DC | PRN

## 2023-04-27 MED ORDER — SERTRALINE HCL 50 MG PO TABS
50.0000 mg | ORAL_TABLET | Freq: Every morning | ORAL | Status: DC
  Administered 2023-04-28 – 2023-04-29 (×2): 50 mg via ORAL
  Filled 2023-04-27 (×2): qty 1

## 2023-04-27 MED ORDER — ZOLPIDEM TARTRATE 5 MG PO TABS: 5.0000 mg | ORAL_TABLET | Freq: Every evening | ORAL | Status: DC | PRN

## 2023-04-27 MED ORDER — PANTOPRAZOLE SODIUM 40 MG PO TBEC
40.0000 mg | DELAYED_RELEASE_TABLET | Freq: Two times a day (BID) | ORAL | Status: DC
  Administered 2023-04-27 – 2023-04-29 (×4): 40 mg via ORAL
  Filled 2023-04-27 (×4): qty 1

## 2023-04-27 MED ORDER — LORAZEPAM 1 MG PO TABS: 1.0000 mg | ORAL_TABLET | ORAL | Status: DC | PRN

## 2023-04-27 MED ORDER — SODIUM CHLORIDE 0.9 % IV SOLN: INTRAVENOUS | Status: DC

## 2023-04-27 MED ORDER — THIAMINE HCL 100 MG/ML IJ SOLN: 100.0000 mg | Freq: Every day | INTRAMUSCULAR | Status: DC

## 2023-04-27 MED ORDER — STROKE: EARLY STAGES OF RECOVERY BOOK
Freq: Once | Status: AC
  Filled 2023-04-27: qty 1

## 2023-04-27 MED ORDER — SODIUM CHLORIDE 0.9% FLUSH
3.0000 mL | Freq: Two times a day (BID) | INTRAVENOUS | Status: DC
  Administered 2023-04-28 (×2): 3 mL via INTRAVENOUS

## 2023-04-27 MED ORDER — LORAZEPAM 1 MG PO TABS
0.0000 mg | ORAL_TABLET | Freq: Four times a day (QID) | ORAL | Status: DC
  Administered 2023-04-29: 1 mg via ORAL
  Filled 2023-04-27: qty 1

## 2023-04-27 MED ORDER — FOLIC ACID 1 MG PO TABS
1.0000 mg | ORAL_TABLET | Freq: Every day | ORAL | Status: DC
  Administered 2023-04-27 – 2023-04-29 (×3): 1 mg via ORAL
  Filled 2023-04-27 (×3): qty 1

## 2023-04-27 MED ORDER — SORBITOL 70 % SOLN: 30.0000 mL | Freq: Every day | Status: DC | PRN

## 2023-04-27 MED ORDER — THIAMINE MONONITRATE 100 MG PO TABS
100.0000 mg | ORAL_TABLET | Freq: Every day | ORAL | Status: DC
  Administered 2023-04-27 – 2023-04-29 (×3): 100 mg via ORAL
  Filled 2023-04-27 (×3): qty 1

## 2023-04-27 MED ORDER — ACETAMINOPHEN 650 MG RE SUPP: 650.0000 mg | Freq: Four times a day (QID) | RECTAL | Status: DC | PRN

## 2023-04-27 MED ORDER — THIAMINE MONONITRATE 100 MG PO TABS: 100.0000 mg | ORAL_TABLET | Freq: Every day | ORAL | Status: DC

## 2023-04-27 MED ORDER — LACTATED RINGERS IV BOLUS
1000.0000 mL | Freq: Once | INTRAVENOUS | Status: AC
  Administered 2023-04-27: 1000 mL via INTRAVENOUS

## 2023-04-27 MED ORDER — LORAZEPAM 2 MG/ML IJ SOLN: 1.0000 mg | INTRAMUSCULAR | Status: DC | PRN

## 2023-04-27 MED ORDER — ADULT MULTIVITAMIN W/MINERALS CH: 1.0000 | ORAL_TABLET | Freq: Every day | ORAL | Status: DC

## 2023-04-27 MED ORDER — POLYETHYLENE GLYCOL 3350 17 G PO PACK: 17.0000 g | PACK | Freq: Every day | ORAL | Status: DC | PRN

## 2023-04-27 MED ORDER — ONDANSETRON HCL 4 MG/2ML IJ SOLN: 4.0000 mg | Freq: Four times a day (QID) | INTRAMUSCULAR | Status: DC | PRN

## 2023-04-27 MED ORDER — LORAZEPAM 1 MG PO TABS: 0.0000 mg | ORAL_TABLET | Freq: Two times a day (BID) | ORAL | Status: DC

## 2023-04-27 MED ORDER — FOLIC ACID 1 MG PO TABS: 1.0000 mg | ORAL_TABLET | Freq: Every day | ORAL | Status: DC

## 2023-04-27 MED ORDER — ADULT MULTIVITAMIN W/MINERALS CH
1.0000 | ORAL_TABLET | Freq: Every day | ORAL | Status: DC
  Administered 2023-04-27 – 2023-04-29 (×3): 1 via ORAL
  Filled 2023-04-27 (×3): qty 1

## 2023-04-27 MED ORDER — MAGNESIUM CITRATE PO SOLN: 1.0000 | Freq: Once | ORAL | Status: DC | PRN

## 2023-04-27 NOTE — ED Notes (Signed)
CRITICAL VALUE STICKER  CRITICAL VALUE:ETOH [id number]  RECEIVER (on-site recipient of call):Carmon Ginsberg, RN  DATE & TIME NOTIFIED:   MESSENGER (representative from lab):  MD NOTIFIED: Dr. Rosalia Hammers  TIME OF NOTIFICATION:1138  RESPONSE:

## 2023-04-27 NOTE — ED Provider Notes (Signed)
[place] EMERGENCY DEPARTMENT AT [facility] Provider Note   CSN: [id number] Arrival date & time: 04/27/23  1013     History  Chief Complaint  Patient presents with   Head Injury   Facial Droop    (Resolved)    Sean Torres is a 47 y.o. male.  HPI 47 yo male ho presents today with episode of decreased ability to speak loc from about 4-6 pm.  He was with a friend and significant other came home and witnessed the symptoms.  He was sitting and appeared to be able to verbalize any responses.  They noted that he had some tremors and some right-sided facial droop.  They do attribute the facial droop to some previous facial injury.  He fell and had orbital fracture last year.  He had some drooping on the side at that time.  However, it is currently completely resolved.  He was reported to be awake and able to respond to them although not verbally during this time.  He had some shaking but does not sound like he had a true grand mal seizure.  Review of records indicates that he had alcohol induced pancreatitis in the past.  He disagrees with this diagnosis.  He states he only drinks the daily recommended amount of alcohol at about 325 of vodka.  He last had alcohol this a.m.  He denies any history of withdrawal or other problems with alcohol.  He states that his heart rate is usually somewhat high.  He is also noted a rash on the left buttocks for some unknown period of time and states he has noted some blood in his stool.  Not complaining of fever, chills, chest pain, dyspnea, nausea, vomiting, diarrhea, or abdominal pain.  He is followed by Dr. Duanne Guess with primary care.  He is taking his prescription medications as prescribed.     Home Medications Prior to Admission medications   Medication Sig Start Date End Date Taking? Authorizing Provider  ALPRAZolam (XANAX) 0.25 MG tablet Take 0.25-0.5 mg by mouth daily as needed for anxiety. 09/27/21   [provider]  amLODipine  (NORVASC) 5 MG tablet Take 5 mg by mouth daily. 09/26/21   [provider]  amoxicillin-clavulanate (AUGMENTIN) 875-125 MG tablet Take 1 tablet by mouth every 12 (twelve) hours. 05/07/22   Lorre Nick, MD  buPROPion (WELLBUTRIN XL) 150 MG 24 hr tablet Take 150 mg by mouth every morning. 07/18/21   [provider]  desonide (DESOWEN) 0.05 % lotion Apply 1 application topically daily as needed (facial skin patch). 12/12/20   [provider]  pantoprazole (PROTONIX) 40 MG tablet Take 40 mg by mouth 2 (two) times daily.    [provider]  potassium chloride SA (KLOR-CON M) 20 MEQ tablet Take 1 tablet (20 mEq total) by mouth daily for 7 days. 10/29/21 11/05/21  Zigmund Daniel., MD  sertraline (ZOLOFT) 50 MG tablet Take 50 mg by mouth in the morning. 12/19/20   [provider]  zolpidem (AMBIEN CR) 6.25 MG CR tablet Take 6.25 mg by mouth at bedtime. 09/26/21   [provider]      Allergies    Erythromycin base    Review of Systems   Review of Systems  Physical Exam Updated Vital Signs BP 127/89   Pulse 94   Temp 98.2 F (36.8 C)   Resp 14   Ht 1.803 m (5\' 11" )   Wt 83.9 kg   SpO2 94%   BMI  25.80 kg/m  Physical Exam Vitals and nursing note reviewed.  Constitutional:      Appearance: Normal appearance.  HENT:     Head: Normocephalic.     Right Ear: External ear normal.     Left Ear: External ear normal.     Nose: Nose normal.     Mouth/Throat:     Mouth: Mucous membranes are moist.     Pharynx: Oropharynx is clear.  Eyes:     Extraocular Movements: Extraocular movements intact.     Conjunctiva/sclera: Conjunctivae normal.     Pupils: Pupils are equal, round, and reactive to light.  Cardiovascular:     Rate and Rhythm: Normal rate and regular rhythm.     Pulses: Normal pulses.  Pulmonary:     Effort: Pulmonary effort is normal.     Breath sounds: Normal breath sounds.  Abdominal:     General: Abdomen is flat. Bowel  sounds are normal.     Palpations: Abdomen is soft.  Musculoskeletal:        General: Normal range of motion.     Cervical back: Normal range of motion and neck supple.  Skin:    General: Skin is warm and dry.     Capillary Refill: Capillary refill takes less than 2 seconds.  Neurological:     General: No focal deficit present.     Mental Status: He is alert. Mental status is at baseline.     Cranial Nerves: No cranial nerve deficit.     Sensory: No sensory deficit.     Motor: No weakness.     Coordination: Coordination normal.     Gait: Gait normal.     Deep Tendon Reflexes: Reflexes normal.  Psychiatric:        Mood and Affect: Mood normal.        Behavior: Behavior normal.     ED Results / Procedures / Treatments   Labs (all labs ordered are listed, but only abnormal results are displayed) Labs Reviewed  CBC - Abnormal; Notable for the following components:      Result Value   RBC 3.80 (*)    MCV 102.9 (*)    MCH 35.3 (*)    Platelets 81 (*)    All other components within normal limits  COMPREHENSIVE METABOLIC PANEL - Abnormal; Notable for the following components:   AST 112 (*)    ALT 102 (*)    Anion gap 17 (*)    All other components within normal limits  ETHANOL - Abnormal; Notable for the following components:   Alcohol, Ethyl (B) 339 (*)    All other components within normal limits  CBG MONITORING, ED - Abnormal; Notable for the following components:   Glucose-Capillary 62 (*)    All other components within normal limits  PROTIME-INR  APTT  DIFFERENTIAL  AMMONIA    EKG EKG Interpretation  Date/Time:  Saturday April 27 2023 10:31:19 EDT Ventricular Rate:  110 PR Interval:  180 QRS Duration: 100 QT Interval:  360 QTC Calculation: 487 R Axis:   -50 Text Interpretation: Sinus tachycardia Left anterior fascicular block Septal infarct (cited on or before 27-Apr-2023) Abnormal ECG When compared with ECG of 27-Apr-2023 10:30, No significant change was  found Confirmed by Margarita Grizzle [phone]) on 04/27/2023 10:45:01 AM  Radiology CT HEAD WO CONTRAST  Result Date: 04/27/2023 CLINICAL DATA:  TIA.  Head injury. EXAM: CT HEAD WITHOUT CONTRAST TECHNIQUE: Contiguous axial images were obtained from the base of the skull through  the vertex without intravenous contrast. RADIATION DOSE REDUCTION: This exam was performed according to the departmental dose-optimization program which includes automated exposure control, adjustment of the mA and/or kV according to patient size and/or use of iterative reconstruction technique. COMPARISON:  Head CT 05/07/2022 FINDINGS: Brain: No evidence of acute infarction, hemorrhage, hydrocephalus, extra-axial collection or mass lesion/mass effect. Vascular: No hyperdense vessel or unexpected calcification. Skull: Normal. Negative for fracture or focal lesion. Sinuses/Orbits: No acute finding. IMPRESSION: No acute or interval finding Electronically Signed   By: Tiburcio Pea M.D.   On: 04/27/2023 10:48    Procedures .Critical Care  Performed by: Margarita Grizzle, MD Authorized by: Margarita Grizzle, MD   Critical care provider statement:    Critical care time (minutes):  60   Critical care was time spent personally by me on the following activities:  Development of treatment plan with patient or surrogate, discussions with consultants, evaluation of patient's response to treatment, examination of patient, ordering and review of laboratory studies, ordering and review of radiographic studies, ordering and performing treatments and interventions, pulse oximetry, re-evaluation of patient's condition and review of old charts     Medications Ordered in ED Medications  lactated ringers bolus 1,000 mL (1,000 mLs Intravenous New Bag/Given 04/27/23 1131)  LORazepam (ATIVAN) injection 1 mg (1 mg Intravenous Given 04/27/23 1131)    ED Course/ Medical Decision Making/ A&P                             Medical Decision Making Amount  and/or Complexity of Data Reviewed Labs: ordered. Radiology: ordered.  Risk Prescription drug management.   47 year old male presents today with episode of decreased ability to speak, tremors, and right facial droop last night that lasted several hours. Differential diagnosis includes but is not limited to stroke, TIA, hypoglycemia, alcohol intoxication or withdrawal, electrolyte abnormality, intracranial mass or bleeding Patient evaluated here in the ED with CT scan of head that does not show mass or bleeding.  No obvious infarct is noted  Here electrolites are normal CBC is within normal limits Transaminases are elevated Alcohol level is elevated at 335  Plan admission for further tia/stroke evaluation Will need MRI, echo, carotid ultrasound Patient with elevated AST and ALT will need to be hydrated to have these followed Patient has significant history of alcohol use and will likely need to be on alcohol withdrawal protocol Discussed with Dr. Katrinka Blazing who accepts in consultations       Final Clinical Impression(s) / ED Diagnoses Final diagnoses:  TIA (transient ischemic attack)  Alcohol abuse    Rx / DC Orders ED Discharge Orders     None         Margarita Grizzle, MD 04/27/23 1353

## 2023-04-27 NOTE — ED Triage Notes (Addendum)
Pt states that he has a hx of head injury and has events he calls "schisms". Pt has had periods of right sided facial droop, mumbling, shaking, headache, pulsating right side of face.  Last occurrence yesterday at 1600. Prior to that, pt's last occurrence was Memorial Day.  Pt states he lost hearing in left ear 2 years ago, has been seen for same. Stroke screen negative in triage. Pt able to answer all questions fully, symmetrical features.

## 2023-04-27 NOTE — Plan of Care (Addendum)
Transfer from [facility] Mr. Dollard is a 47 year old male with pmh of HTN, suspected alcohol abuse, prior orbital fracture  2/2 fall, allergies, and sleep apnea who presented after having episode of slurred speech with right-sided facial droop and shaking that lasted approximately 2 hours yesterday.  CT scan of the brain did not note any acute abnormality.  Workup significant for ST 112, ALT 102, and alcohol level of 339.  Admission requested for completion of a TIA workup.  Patient has been given 1 L of lactated Ringer's and Ativan 1 mg IV.  Accepted to a telemetry bed as observation

## 2023-04-27 NOTE — Plan of Care (Signed)
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
## 2023-04-27 NOTE — Progress Notes (Signed)
LTM EEG hooked up and running - no initial skin breakdown - push button tested - [facility] monitoring.  

## 2023-04-27 NOTE — ED Notes (Signed)
Provided pt with food and drink. 

## 2023-04-27 NOTE — ED Notes (Signed)
Called [facility] for transport spoke with Ruby at 3:27

## 2023-04-27 NOTE — ED Notes (Signed)
[facility] left with patient.

## 2023-04-28 ENCOUNTER — Inpatient Hospital Stay (HOSPITAL_COMMUNITY): Payer: BC Managed Care – PPO

## 2023-04-28 ENCOUNTER — Observation Stay (HOSPITAL_COMMUNITY): Payer: BC Managed Care – PPO

## 2023-04-28 ENCOUNTER — Encounter (HOSPITAL_COMMUNITY): Payer: BC Managed Care – PPO

## 2023-04-28 DIAGNOSIS — Z808 Family history of malignant neoplasm of other organs or systems: Secondary | ICD-10-CM | POA: Diagnosis not present

## 2023-04-28 DIAGNOSIS — D6959 Other secondary thrombocytopenia: Secondary | ICD-10-CM | POA: Diagnosis present

## 2023-04-28 DIAGNOSIS — H9192 Unspecified hearing loss, left ear: Secondary | ICD-10-CM | POA: Diagnosis present

## 2023-04-28 DIAGNOSIS — Z6825 Body mass index (BMI) 25.0-25.9, adult: Secondary | ICD-10-CM | POA: Diagnosis not present

## 2023-04-28 DIAGNOSIS — F419 Anxiety disorder, unspecified: Secondary | ICD-10-CM | POA: Diagnosis present

## 2023-04-28 DIAGNOSIS — I10 Essential (primary) hypertension: Secondary | ICD-10-CM | POA: Diagnosis present

## 2023-04-28 DIAGNOSIS — Y908 Blood alcohol level of 240 mg/100 ml or more: Secondary | ICD-10-CM | POA: Diagnosis present

## 2023-04-28 DIAGNOSIS — Z833 Family history of diabetes mellitus: Secondary | ICD-10-CM | POA: Diagnosis not present

## 2023-04-28 DIAGNOSIS — R4701 Aphasia: Secondary | ICD-10-CM | POA: Diagnosis present

## 2023-04-28 DIAGNOSIS — E785 Hyperlipidemia, unspecified: Secondary | ICD-10-CM | POA: Diagnosis present

## 2023-04-28 DIAGNOSIS — Z83719 Family history of colon polyps, unspecified: Secondary | ICD-10-CM | POA: Diagnosis not present

## 2023-04-28 DIAGNOSIS — K219 Gastro-esophageal reflux disease without esophagitis: Secondary | ICD-10-CM | POA: Diagnosis present

## 2023-04-28 DIAGNOSIS — Z79899 Other long term (current) drug therapy: Secondary | ICD-10-CM | POA: Diagnosis not present

## 2023-04-28 DIAGNOSIS — I444 Left anterior fascicular block: Secondary | ICD-10-CM | POA: Diagnosis present

## 2023-04-28 DIAGNOSIS — E669 Obesity, unspecified: Secondary | ICD-10-CM | POA: Diagnosis present

## 2023-04-28 DIAGNOSIS — R479 Unspecified speech disturbances: Secondary | ICD-10-CM | POA: Diagnosis not present

## 2023-04-28 DIAGNOSIS — Z881 Allergy status to other antibiotic agents status: Secondary | ICD-10-CM | POA: Diagnosis not present

## 2023-04-28 DIAGNOSIS — F1010 Alcohol abuse, uncomplicated: Secondary | ICD-10-CM | POA: Diagnosis not present

## 2023-04-28 DIAGNOSIS — F10231 Alcohol dependence with withdrawal delirium: Secondary | ICD-10-CM | POA: Diagnosis present

## 2023-04-28 DIAGNOSIS — R2990 Unspecified symptoms and signs involving the nervous system: Secondary | ICD-10-CM

## 2023-04-28 DIAGNOSIS — K649 Unspecified hemorrhoids: Secondary | ICD-10-CM | POA: Diagnosis present

## 2023-04-28 DIAGNOSIS — Z800 Family history of malignant neoplasm of digestive organs: Secondary | ICD-10-CM | POA: Diagnosis not present

## 2023-04-28 DIAGNOSIS — K625 Hemorrhage of anus and rectum: Secondary | ICD-10-CM | POA: Diagnosis present

## 2023-04-28 DIAGNOSIS — Z8249 Family history of ischemic heart disease and other diseases of the circulatory system: Secondary | ICD-10-CM | POA: Diagnosis not present

## 2023-04-28 DIAGNOSIS — I5031 Acute diastolic (congestive) heart failure: Secondary | ICD-10-CM

## 2023-04-28 DIAGNOSIS — G459 Transient cerebral ischemic attack, unspecified: Secondary | ICD-10-CM | POA: Diagnosis present

## 2023-04-28 DIAGNOSIS — Z87891 Personal history of nicotine dependence: Secondary | ICD-10-CM | POA: Diagnosis not present

## 2023-04-28 DIAGNOSIS — K760 Fatty (change of) liver, not elsewhere classified: Secondary | ICD-10-CM | POA: Diagnosis present

## 2023-04-28 DIAGNOSIS — F32A Depression, unspecified: Secondary | ICD-10-CM | POA: Diagnosis present

## 2023-04-28 DIAGNOSIS — R569 Unspecified convulsions: Secondary | ICD-10-CM | POA: Diagnosis not present

## 2023-04-28 DIAGNOSIS — G4733 Obstructive sleep apnea (adult) (pediatric): Secondary | ICD-10-CM | POA: Diagnosis present

## 2023-04-28 LAB — LIPID PANEL
Cholesterol: 213 mg/dL — ABNORMAL HIGH (ref 0–200)
HDL: 87 mg/dL (ref >40)
LDL Cholesterol: 119 mg/dL — ABNORMAL HIGH (ref 0–99)
Total CHOL/HDL Ratio: 2.4 RATIO
Triglycerides: 37 mg/dL (ref <150)
VLDL: 7 mg/dL (ref 0–40)

## 2023-04-28 LAB — HEMOGLOBIN A1C
Hgb A1c MFr Bld: 4.9 % (ref 4.8–5.6)
Mean Plasma Glucose: 93.93 mg/dL

## 2023-04-28 LAB — COMPREHENSIVE METABOLIC PANEL
ALT: 91 U/L — ABNORMAL HIGH (ref 0–44)
AST: 96 U/L — ABNORMAL HIGH (ref 15–41)
Albumin: 3.7 g/dL (ref 3.5–5.0)
Alkaline Phosphatase: 66 U/L (ref 38–126)
Anion gap: 14 (ref 5–15)
BUN: 5 mg/dL — ABNORMAL LOW (ref 6–20)
CO2: 23 mmol/L (ref 22–32)
Calcium: 8.5 mg/dL — ABNORMAL LOW (ref 8.9–10.3)
Chloride: 102 mmol/L (ref 98–111)
Creatinine, Ser: 0.72 mg/dL (ref 0.61–1.24)
GFR, Estimated: >60 mL/min (ref >60)
Glucose, Bld: 94 mg/dL (ref 70–99)
Potassium: 3.6 mmol/L (ref 3.5–5.1)
Sodium: 139 mmol/L (ref 135–145)
Total Bilirubin: 0.8 mg/dL (ref 0.3–1.2)
Total Protein: 6.4 g/dL — ABNORMAL LOW (ref 6.5–8.1)

## 2023-04-28 LAB — ECHOCARDIOGRAM COMPLETE
AR max vel: 3.2 cm2
AV Area VTI: 2.77 cm2
AV Area mean vel: 2.93 cm2
AV Mean grad: 2 mmHg
AV Peak grad: 4.5 mmHg
Ao pk vel: 1.06 m/s
Area-P 1/2: 4.89 cm2
Height: 71 in
S' Lateral: 2.6 cm
Weight: 2958.4 oz

## 2023-04-28 LAB — CBC
HCT: 34.3 % — ABNORMAL LOW (ref 39.0–52.0)
Hemoglobin: 11.9 g/dL — ABNORMAL LOW (ref 13.0–17.0)
MCH: 35.8 pg — ABNORMAL HIGH (ref 26.0–34.0)
MCHC: 34.7 g/dL (ref 30.0–36.0)
MCV: 103.3 fL — ABNORMAL HIGH (ref 80.0–100.0)
Platelets: 66 10*3/uL — ABNORMAL LOW (ref 150–400)
RBC: 3.32 MIL/uL — ABNORMAL LOW (ref 4.22–5.81)
RDW: 12.1 % (ref 11.5–15.5)
WBC: 3.2 10*3/uL — ABNORMAL LOW (ref 4.0–10.5)
nRBC: 0 % (ref 0.0–0.2)

## 2023-04-28 MED ORDER — LORAZEPAM 2 MG/ML IJ SOLN
1.0000 mg | Freq: Once | INTRAMUSCULAR | Status: AC
  Administered 2023-04-28: 1 mg via INTRAVENOUS
  Filled 2023-04-28: qty 1

## 2023-04-28 MED ORDER — POTASSIUM CHLORIDE CRYS ER 20 MEQ PO TBCR
40.0000 meq | EXTENDED_RELEASE_TABLET | Freq: Once | ORAL | Status: AC
  Administered 2023-04-28: 40 meq via ORAL
  Filled 2023-04-28: qty 2

## 2023-04-28 MED ORDER — IOHEXOL 350 MG/ML SOLN
150.0000 mL | Freq: Once | INTRAVENOUS | Status: AC | PRN
  Administered 2023-04-28: 150 mL via INTRAVENOUS

## 2023-04-28 MED ORDER — LORAZEPAM 2 MG/ML IJ SOLN: 1.0000 mg | INTRAMUSCULAR | Status: DC | PRN

## 2023-04-28 MED ORDER — HEPARIN SODIUM (PORCINE) 5000 UNIT/ML IJ SOLN
5000.0000 [IU] | Freq: Three times a day (TID) | INTRAMUSCULAR | Status: DC
  Administered 2023-04-28 – 2023-04-29 (×3): 5000 [IU] via SUBCUTANEOUS
  Filled 2023-04-28 (×3): qty 1

## 2023-04-28 MED ORDER — SODIUM CHLORIDE 0.9 % IV SOLN: INTRAVENOUS | Status: DC

## 2023-04-28 MED ORDER — CHLORDIAZEPOXIDE HCL 5 MG PO CAPS
20.0000 mg | ORAL_CAPSULE | Freq: Three times a day (TID) | ORAL | Status: DC
  Administered 2023-04-28 (×3): 20 mg via ORAL
  Filled 2023-04-28 (×3): qty 4

## 2023-04-28 NOTE — Procedures (Signed)
Patient Name: Sean Torres  MRN: [id number]  Epilepsy Attending: Charlsie Quest  Referring Physician/Provider: Erick Blinks, MD  Duration: 04/26/2022 2215 to 04/28/2023 1014  Patient history: 47 y.o. male with PMH significant for OSA, HTN, GERD, nephrolithiasis, hemorrhoids, EtOH use who had a 2 hour episode of aphasia, and ?right facial droop. Does endorse being stressed and not sure if this is entirely 2/2 stress. EEG to evaluate for seizure  Level of alertness: Awake, asleep  AEDs during EEG study: Ativan  Technical aspects: This EEG study was done with scalp electrodes positioned according to the 10-20 International system of electrode placement. Electrical activity was reviewed with band pass filter of 1-70Hz , sensitivity of 7 uV/mm, display speed of 42mm/sec with a 60Hz  notched filter applied as appropriate. EEG data were recorded continuously and digitally stored.  Video monitoring was available and reviewed as appropriate.  Description: The posterior dominant rhythm consists of 8-9 Hz activity of moderate voltage (25-35 uV) seen predominantly in posterior head regions, symmetric and reactive to eye opening and eye closing. Sleep was characterized by vertex waves, sleep spindles (12 to 14 Hz), maximal frontocentral region.  Physiologic photic driving was not seen during photic stimulation.  Hyperventilation was not performed.     IMPRESSION: This study is within normal limits. No seizures or epileptiform discharges were seen throughout the recording.  A normal interictal EEG does not exclude  the diagnosis of epilepsy.  Glenyce Randle Annabelle Harman

## 2023-04-28 NOTE — Evaluation (Signed)
Occupational Therapy Evaluation Patient Details Name: Sean Torres MRN: [id number] DOB: [birth year] Today's Date: 04/28/2023   History of Present Illness Pt is a 47 y.o. male who presented to the ED due to a 2-hour history of an expressive aphasia, difficulty ambulating, slight right facial droop, and some intermittent bright red blood per rectum. Pt was admitted for TIA/CVA workup 6/22. Pt with PMH significant of GERD, depression/anxiety, nephrolithiasis, hemorrhoids, left ear hearing loss, 2 head injuries in the past year, and daily heavy alcohol use.   Clinical Impression   At baseline, pt is Independent with ADLs, IADLs, and functional mobility without an AD. Pt drives and works. Pt now presents at or within 95% of baseline. Pt currently demonstrates ability to perform all ADLs and functional mobility without an AD Independently. However, OT recommends Supervision for bathing and and functional transfers/mobility at this times secondary to pt continuing to undergo neuro work up and pt with episode of increased HR while ambulating in room during skilled OT eval. OT educated pt and family and friend in same with all verbalizing understanding. No additional acute skilled OT services are indicated at this time. No post acute OT follow up is indicated at this time. OT is signing off.      Recommendations for follow up therapy are one component of a multi-disciplinary discharge planning process, led by the attending physician.  Recommendations may be updated based on patient status, additional functional criteria and insurance authorization.   Assistance Recommended at Discharge PRN  Patient can return home with the following      Functional Status Assessment  Patient has not had a recent decline in their functional status  Equipment Recommendations  None recommended by OT    Recommendations for Other Services       Precautions / Restrictions Precautions Precautions: Fall Restrictions Weight  Bearing Restrictions: No      Mobility Bed Mobility Overal bed mobility: Independent                  Transfers Overall transfer level: Needs assistance Equipment used: None Transfers: Sit to/from Stand, Bed to chair/wheelchair/BSC Sit to Stand: Supervision     Step pivot transfers: Supervision     General transfer comment: Pt demonstrates ability to perform functional transfers/mobility Independent with no LOB, no SOB, and with pt reporting he feels he is at baseline. However, due to pt participating in further neuro work up and pt with episode of increased HR with functional mobility during OT eval, OT recommends Supervision during  functional transfers and functional mobility at this time for pt safety with OT educating pt and family/friend in the same with all verbalizing understanding.      Balance Overall balance assessment: Independent                                         ADL either performed or assessed with clinical judgement   ADL Overall ADL's : At baseline (OT recommends Supervision for pt safety during this admission.)                                       General ADL Comments: Pt demonstrates ability to perform all ADLs Independent with no LOB, no SOB, and with pt reporting he feels he is at baseline. However, due to pt  participating in further neuro work up and pt with episode of increased HR with functional mobility during OT eval, OT recommends Supervision during bathing, functional transfers, and functional mobility at this time for pt safety with OT educating pt and family/friend in the same with all verbalizing understanding.     Vision Baseline Vision/History: 0 No visual deficits (hx of Lasik Bilaterally) Ability to See in Adequate Light: 0 Adequate Patient Visual Report: No change from baseline Vision Assessment?: Yes Eye Alignment: Within Functional Limits Ocular Range of Motion: Within Functional  Limits Alignment/Gaze Preference: Within Defined Limits Tracking/Visual Pursuits: Able to track stimulus in all quads without difficulty Saccades: Within functional limits Convergence: Within functional limits Visual Fields: Right superior homonymous quadranopsia (mild)     Perception     Praxis Praxis Praxis tested?: Within functional limits    Pertinent Vitals/Pain Pain Assessment Pain Assessment: No/denies pain     Hand Dominance Right   Extremity/Trunk Assessment Upper Extremity Assessment Upper Extremity Assessment: Overall WFL for tasks assessed   Lower Extremity Assessment Lower Extremity Assessment: Defer to PT evaluation   Cervical / Trunk Assessment Cervical / Trunk Assessment: Normal   Communication Communication Communication: No difficulties;Other (comment) (Hx Left hearing loss)   Cognition Arousal/Alertness: Awake/alert Behavior During Therapy: WFL for tasks assessed/performed Overall Cognitive Status: Within Functional Limits for tasks assessed                                 General Comments: Pt with impulsiveness, mildly impaired safety awareness, and impaired insight into deficits. Pt and family and friend present throughout OT eval state pt is at his baseline.     General Comments  Pt with HR increasing into the mid-150s with functional mobility with pt quickly recovering with sitting rest break. Pt reports this is normal for him and further states his HR often going into the low 200s when exercising. OT notified RN and encouraged pt to speak with his MD regarding HR with pt verbalizing understanding. Pt's spouse and friend present throughout OT eval.     Exercises     Shoulder Instructions      Home Living Family/patient expects to be discharged to:: Private residence Living Arrangements: Spouse/significant other;Non-relatives/Friends;Children Available Help at Discharge: Family;Friend(s);Available 24 hours/day Type of Home:  House Home Access: Stairs to enter [employer] of Steps: 2 Entrance Stairs-Rails: None Home Layout: Two level;Able to live on main level with bedroom/bathroom Alternate Level Stairs-Number of Steps: flight Alternate Level Stairs-Rails: Can reach both Bathroom Shower/Tub: Walk-in shower;Tub/shower unit   Bathroom Toilet: Handicapped height     Home Equipment: Information systems manager;Shower seat - built in;Hand held Programmer, systems (2 wheels);Wheelchair - manual          Prior Functioning/Environment Prior Level of Function : Independent/Modified Independent             Mobility Comments: independent ADLs Comments: independent, works, drives        OT Problem List:        OT Treatment/Interventions:      OT Goals(Current goals can be found in the care plan section) Acute Rehab OT Goals Patient Stated Goal: To return home  OT Frequency:      Co-evaluation              AM-PAC OT "6 Clicks" Daily Activity     Outcome Measure Help from another person eating meals?: None Help from another person taking care  of personal grooming?: None Help from another person toileting, which includes using toliet, bedpan, or urinal?: None Help from another person bathing (including washing, rinsing, drying)?: None Help from another person to put on and taking off regular upper body clothing?: None Help from another person to put on and taking off regular lower body clothing?: None 6 Click Score: 24   End of Session Nurse Communication: Mobility status;Other (comment) (Increased HR with functional mobility. OT recommends Supervision for bathing and functional transfers/mobility at this time. OT signing off.)  Activity Tolerance: Patient tolerated treatment well Patient left: in bed;with call bell/phone within reach;with family/visitor present  OT Visit Diagnosis: Other symptoms and signs involving the nervous system (K44.010)                Time: 2725-3664 OT Time  Calculation (min): 30 min Charges:  OT General Charges $OT Visit: 1 Visit OT Evaluation $OT Eval Moderate Complexity: 1 Mod  Ximena Todaro "[employer] OTR/L, MA Acute Rehab [phone]   Lendon Colonel 04/28/2023, 11:23 AM

## 2023-04-28 NOTE — Evaluation (Signed)
Clinical/Bedside Swallow Evaluation Patient Details  Name: Sean Torres MRN: [id number] Date of Birth: 1976/08/01  Today's Date: 04/28/2023 Time: SLP Start Time (ACUTE ONLY): 1410 SLP Stop Time (ACUTE ONLY): 1422 SLP Time Calculation (min) (ACUTE ONLY): 12 min  Past Medical History:  Past Medical History:  Diagnosis Date   Allergy    seasonal   Hypertension    Sleep apnea    Past Surgical History:  Past Surgical History:  Procedure Laterality Date   COLONOSCOPY  06/2018   FOOT SURGERY Bilateral    "had a pin put in my toes in 09/2019 and 2018, but I'm not sure what surgery it was specifically"    LUMBAR DISC SURGERY     RADIOLOGY WITH ANESTHESIA N/A 03/14/2021   Procedure: MRI WITH ANESTHESIA  BRAIN TO INCLUDE IAC WITH AND WITHOUT CONTRAST;  Surgeon: Radiologist, Medication, MD;  Location: [facility] OR;  Service: Radiology;  Laterality: N/A;   HPI:  Pt is a 47 y.o. male who presented to the ED due to a 2-hour history of an expressive aphasia, difficulty ambulating, slight right facial droop, and some intermittent bright red blood per rectum. Pt was admitted for TIA/CVA workup 6/22. Pt with PMH significant of GERD, depression/anxiety, nephrolithiasis, hemorrhoids, left ear hearing loss, 2 head injuries in the past year, and daily heavy alcohol use.  Head CT negative for acute abnormality, MRI pending.    Assessment / Plan / Recommendation  Clinical Impression  Pt was seen for a bedside swallow evaluation in the setting of possible TIA/CVA and he presents with suspected functional oropharyngeal swallowing abilities.  Pt was encountered awake/alert with friend at bedside.  Oral mechanism exam was unremarkable.  Pt consumed trials of thin liquid, puree, and regular solids.  Pt fed himself independently and he demonstrated good bolus acceptance, timely mastication, suspected timely AP transport/swallow initiation, and consistent hyolaryngeal elevation/excursion to observation and palpation.  No overt  s/sx of aspiration were observed with any trials.  Recommend continuation of regular solids and thin liquids with medications administered whole with liquid.  No further skilled ST is warranted at this time.  Please re-consult if additional needs arise.    SLP Visit Diagnosis: Dysphagia, unspecified (R13.10)    Aspiration Risk  No limitations    Diet Recommendation Regular;Thin liquid   Liquid Administration via: Cup;Straw Medication Administration: Whole meds with liquid Supervision: Patient able to self feed Compensations: Minimize environmental distractions;Slow rate;Small sips/bites Postural Changes: Seated upright at 90 degrees    Other  Recommendations Oral Care Recommendations: Oral care BID    Recommendations for follow up therapy are one component of a multi-disciplinary discharge planning process, led by the attending physician.  Recommendations may be updated based on patient status, additional functional criteria and insurance authorization.  Follow up Recommendations No SLP follow up      Assistance Recommended at Discharge    Functional Status Assessment Patient has had a recent decline in their functional status and demonstrates the ability to make significant improvements in function in a reasonable and predictable amount of time.  Frequency and Duration            Prognosis Prognosis for improved oropharyngeal function: Good      Swallow Study   General Date of Onset: 04/28/23 HPI: Pt is a 47 y.o. male who presented to the ED due to a 2-hour history of an expressive aphasia, difficulty ambulating, slight right facial droop, and some intermittent bright red blood per rectum. Pt was admitted for TIA/CVA workup  6/22. Pt with PMH significant of GERD, depression/anxiety, nephrolithiasis, hemorrhoids, left ear hearing loss, 2 head injuries in the past year, and daily heavy alcohol use.  Head CT negative for acute abnormality, MRI pending. Type of Study: Bedside Swallow  Evaluation Previous Swallow Assessment: N/A Diet Prior to this Study: Regular;Thin liquids (Level 0) Temperature Spikes Noted: No Respiratory Status: Room air History of Recent Intubation: No Behavior/Cognition: Alert;Cooperative;Pleasant mood Oral Cavity Assessment: Within Functional Limits Oral Care Completed by SLP: No Oral Cavity - Dentition: Adequate natural dentition Vision: Functional for self-feeding Self-Feeding Abilities: Able to feed self Patient Positioning: Upright in bed Baseline Vocal Quality: Normal Volitional Cough: Strong Volitional Swallow: Able to elicit    Oral/Motor/Sensory Function Overall Oral Motor/Sensory Function: Within functional limits   Ice Chips Ice chips: Not tested   Thin Liquid Thin Liquid: Within functional limits    Nectar Thick Nectar Thick Liquid: Not tested   Honey Thick Honey Thick Liquid: Not tested   Puree Puree: Within functional limits   Solid     Solid: Within functional limits     Eino Farber, M.S., CCC-SLP Acute Rehabilitation Services Office: [phone]  Shanon Rosser Zanyia Silbaugh 04/28/2023,2:41 PM

## 2023-04-28 NOTE — Progress Notes (Signed)
LTM EEG discontinued - no skin breakdown at unhook. GMD/NW 

## 2023-04-28 NOTE — Progress Notes (Signed)
SLP Cancellation Note  Patient Details Name: Sean Torres MRN: [id number] DOB: 11/16/1975   Cancelled treatment:       Reason Eval/Treat Not Completed: Patient at procedure or test/unavailable.  SLP will f/u for clinical swallow evaluation and cognitive-linguistic evaluation as schedule allows.    Shanon Rosser Gerlean Cid 04/28/2023, 12:43 PM

## 2023-04-28 NOTE — Progress Notes (Signed)
PT Cancellation/Discharge Note  Patient Details Name: Sean Torres MRN: [id number] DOB: 07/03/1976   Cancelled Treatment:    Reason Eval/Treat Not Completed: (P) PT screened, no needs identified, will sign off. Discussed pt's current functional status with OT who reported pt is independent with all functional mobility, not displaying any overt LOB during their session. Met with pt and pt and family agree he is at his baseline with no noted functional deficits. MMT scores of 5 grossly and sensation and coordination intact in bil lower extremities. No acute or follow-up PT needs identified. Will screen and sign off.    Sean Torres, PT, DPT Acute Rehabilitation Services  Office: [phone]    Sean Torres 04/28/2023, 12:50 PM

## 2023-04-28 NOTE — Progress Notes (Signed)
Neurology Progress Note  Brief HPI: 47 year old patient with history of GERD, depression, anxiety, nephrolithiasis, hemorrhoids, left ear hearing loss, 2 significant head injuries in the last year and alcohol abuse presented with a 2-hour episode of expressive aphasia, difficulty ambulating and right facial droop.  Over the past 6 months, patient has had about 1 episode of these symptoms each month.  He does drink EtOH daily and endorses significant personal stressors recently.  Subjective: Patient reports that he is back to normal and has had no more episodes overnight.  He is noted to be hypertensive with fine hand tremors.  Primary team beginning treatment for EtOH withdrawal. He feels episodes are related to TBI   Exam: Vitals:   04/28/23 0520 04/28/23 0832  BP: (!) 149/109 (!) 172/115  Pulse: 96 (!) 104  Resp:    Temp:    SpO2: 94% 95%   Gen: In bed, NAD Resp: non-labored breathing, no acute distress Abd: soft, nt  Neuro: Mental Status: Alert and oriented to person place time and situation Cranial Nerves: Pupils equal round and reactive to light, extraocular movements intact, facial sensation symmetrical, face symmetrical, hearing intact to voice, phonation normal, shoulder shrug symmetrical, tongue midline Motor: Normal antigravity strength in all 4 extremities Sensory: Intact to light touch throughout Coordination: FTN intact bilaterally with some hand tremor on the left Gait: Deferred  Pertinent Labs:    Latest Ref Rng & Units 04/28/2023    4:31 AM 04/27/2023   10:54 AM 10/29/2021    5:56 AM  CBC  WBC 4.0 - 10.5 K/uL 3.2  5.0  4.9   Hemoglobin 13.0 - 17.0 g/dL 16.1  09.6  04.5   Hematocrit 39.0 - 52.0 % 34.3  39.1  37.8   Platelets 150 - 400 K/uL 66  81  66        Latest Ref Rng & Units 04/28/2023    4:31 AM 04/27/2023   10:54 AM 10/29/2021    5:56 AM  BMP  Glucose 70 - 99 mg/dL 94  75  409   BUN 6 - 20 mg/dL 5  9  8    Creatinine 0.61 - 1.24 mg/dL 8.11  9.14  7.82    Sodium 135 - 145 mmol/L 139  140  132   Potassium 3.5 - 5.1 mmol/L 3.6  3.8  2.8   Chloride 98 - 111 mmol/L 102  98  98   CO2 22 - 32 mmol/L 23  25  26    Calcium 8.9 - 10.3 mg/dL 8.5  9.2  8.0     Lipid Panel     Component Value Date/Time   CHOL 213 (H) 04/28/2023 0656   TRIG 37 04/28/2023 0656   HDL 87 04/28/2023 0656   CHOLHDL 2.4 04/28/2023 0656   VLDL 7 04/28/2023 0656   LDLCALC 119 (H) 04/28/2023 0656  A1c 4.9 Imaging Reviewed:  CT head: No acute abnormality CTA head and neck personally reviewed, agree with radiology:   1. No acute intracranial abnormality. 2. No intracranial large vessel occlusion or significant stenosis. 3. No hemodynamically significant stenosis in the neck. MRI brain -- unable to tolerate  Assessment: 47 year old patient with history of sleep apnea, hypertension, GERD, nephrolithiasis, 2 significant head injuries in the past year, hemorrhoids and daily alcohol use presents with a 2-hour episode of aphasia and right facial droop.  Patient has had 1 of these episodes per month for the last 6 months.  Long-term EEG overnight demonstrated no seizure activity, and patient has  had no subsequent episodes.  He is noted to be hypertensive with hand tremors, and primary team is beginning treatment for EtOH withdrawal.  Will discontinue long-term EEG and obtain brain MRI, will need full stroke workup if positive.  Impression: Transient episode of aphasia and right facial droop and patient with history of significant head injuries and daily ETOH use  Recommendations: -Discontinue long-term EEG -Treatment of EtOH withdrawal per primary team -Alcohol cessation counseling provided, patient feels he can stop drinking whenever he wants -MRI brain without contrast would be ideal given patient intolerance of scan, CTA head and neck with repeat Head CT included was completed on neurology recommendations -Given negative imaging and EEG, inpatient neurology will sign off,  please do not hesitate to reach out with any questions or concerns  Cortney E Ernestina Columbia , MSN, AGACNP-BC [facility] See Amion for schedule and pager information 04/28/2023 9:45 AM  Attending Neurologist's note:  I personally saw this patient, gathering history, performing a full neurologic examination, reviewing relevant labs, personally reviewing relevant imaging including Head CT and CTA, and formulated the assessment and plan, adding the note above for completeness and clarity to accurately reflect my thoughts  Brooke Dare MD-PhD [facility] [phone]  Available 7 AM to 7 PM, outside these hours please contact Neurologist on call listed on AMION

## 2023-04-28 NOTE — Progress Notes (Signed)
PROGRESS NOTE                                                                                                                                                                                                             Patient Demographics:    Sean Torres, is a 47 y.o. male, DOB - [birth year], GNF:[id number]  Outpatient Primary MD for the patient is Sean Moccasin, MD    LOS - 0  Admit date - 04/27/2023    Chief Complaint  Patient presents with   Head Injury   Facial Droop    (Resolved)       Brief Narrative (HPI from H&P)   47 y.o. male with medical history significant of with past medical history of GERD, depression/anxiety, nephrolithiasis, hemorrhoids, left ear hearing loss, alcohol use presented to the [facility] ED with a 2-hour history of an expressive aphasia, difficulty ambulating, slight right facial droop, also some intermittent bright red blood per rectum.  Has had history of multiple head injuries, he is a heavy consumer of alcohol on a daily basis, was brought to the ER and was admitted for TIA/CVA workup, also now appears to be going into DTs.     Subjective:    [name] today has, No headache, No chest pain, No abdominal pain - No Nausea, No new weakness tingling or numbness, no SOB.   Assessment  & Plan :    Episode of expressive aphasia, poor balance, slight right-sided facial droop, tremors. His symptoms were not very conclusive of a stroke, seen by neurology, undergoing TIA workup, no neurodeficits this morning, he also has history of heavy alcohol abuse and multiple vague head injuries over the last 1 year.  He could certainly be having alcohol withdrawal/alcohol overdose symptoms, EEG over the course of last 8 hours nonacute, CT head nonacute, awaiting MRI brain, CTA head and neck, echocardiogram.  Currently going in early DTs, see below.  PT, OT and speech eval.   Daily alcohol abuse with DTs.   Counseled to quit, has mild asymptomatic transaminitis due to it.  Currently going in early DTs, placed on Librium along with CIWA protocol.  Alcohol abuse related, mild thrombocytopenia, asymptomatic transaminitis.  Due to #1 above.  CT abdomen ordered for blood per rectum at the time of admission will be reviewed, will evaluate liver morphology as  well, outpatient GI follow-up is recommended.  Dyslipidemia.  Low-dose Crestor in the next few days once transaminitis stabilizes.  GERD.  PPI.  Depression/anxiety. Resume home regimen Wellbutrin and Zoloft.  Hypertension and sinus tachycardia.  Due to DTs, TSH stable, once MRI is stable will place him on appropriate medications for the same.  Intermittent outpatient bright red blood per rectum.  Sounds hemorrhoidal, CT ordered upon admission will be followed, PPI, outpatient GI follow-up.  Drop in H&H is hemodilutional due to IV fluids.  Atypical chest pain at the time of admission.  Resolved.  Follow echo, placed on statin, low-dose beta-blocker if MRI brain stable, outpatient cardiology follow-up.   Request PCP to arrange for outpatient GI and cardiology follow-up postdischarge.       Condition - Fair  Family Communication  : Girlfriend bedside on 04/28/2023  Code Status :  Full  Consults  :  Neuro  PUD Prophylaxis : PPI   Procedures  :     MRI  CTA  TTE      Disposition Plan  :    Status is: Observation  DVT Prophylaxis  :    SCDs Start: 04/27/23 1844    Lab Results  Component Value Date   PLT 66 (L) 04/28/2023    Diet :  Diet Order             Diet Heart Room service appropriate? Yes; Fluid consistency: Thin  Diet effective now                    Inpatient Medications  Scheduled Meds:  buPROPion  150 mg Oral q morning   chlordiazePOXIDE  20 mg Oral TID   folic acid  1 mg Oral Daily   LORazepam  1 mg Intravenous Once   LORazepam  0-4 mg Oral Q6H   Followed by   Melene Muller ON 04/29/2023] LORazepam   0-4 mg Oral Q12H   multivitamin with minerals  1 tablet Oral Daily   pantoprazole  40 mg Oral BID   sertraline  50 mg Oral q AM   sodium chloride flush  3 mL Intravenous Q12H   thiamine  100 mg Oral Daily   Or   thiamine  100 mg Intravenous Daily   Continuous Infusions:  sodium chloride     PRN Meds:.acetaminophen **OR** acetaminophen, LORazepam, LORazepam **OR** LORazepam, magnesium citrate, metoprolol tartrate, [DISCONTINUED] ondansetron **OR** ondansetron (ZOFRAN) IV, polyethylene glycol, sorbitol  Antibiotics  :    Anti-infectives (From admission, onward)    None         Objective:   Vitals:   04/28/23 0400 04/28/23 0500 04/28/23 0520 04/28/23 0832  BP: (!) 135/107  (!) 149/109 (!) 172/115  Pulse: 92  96 (!) 104  Resp: 18     Temp: 97.7 F (36.5 C)     TempSrc: Oral     SpO2: 93%  94% 95%  Weight:  83.9 kg    Height:        Wt Readings from Last 3 Encounters:  04/28/23 83.9 kg  11/20/22 83.5 kg  05/07/22 84 kg     Intake/Output Summary (Last 24 hours) at 04/28/2023 0950 Last data filed at 04/27/2023 1836 Gross per 24 hour  Intake 1003.55 ml  Output --  Net 1003.55 ml     Physical Exam  Awake Alert, No new F.N deficits, seems to be in early DTs now [place].AT,PERRAL Supple Neck, No JVD,   Symmetrical Chest wall movement, Good air movement  bilaterally, CTAB RRR,No Gallops,Rubs or new Murmurs,  +ve B.Sounds, Abd Soft, No tenderness,   No Cyanosis, Clubbing or edema       Data Review:    Recent Labs  Lab 04/27/23 1054 04/28/23 0431  WBC 5.0 3.2*  HGB 13.4 11.9*  HCT 39.1 34.3*  PLT 81* 66*  MCV 102.9* 103.3*  MCH 35.3* 35.8*  MCHC 34.3 34.7  RDW 12.4 12.1  LYMPHSABS 1.6  --   MONOABS 0.6  --   EOSABS 0.2  --   BASOSABS 0.0  --     Recent Labs  Lab 04/27/23 1054 04/27/23 1209 04/27/23 1950 04/28/23 0431 04/28/23 0656  NA 140  --   --  139  --   K 3.8  --   --  3.6  --   CL 98  --   --  102  --   CO2 25  --   --  23  --    ANIONGAP 17*  --   --  14  --   GLUCOSE 75  --   --  94  --   BUN 9  --   --  5*  --   CREATININE 0.67  --   --  0.72  --   AST 112*  --   --  96*  --   ALT 102*  --   --  91*  --   ALKPHOS 72  --   --  66  --   BILITOT 0.6  --   --  0.8  --   ALBUMIN 4.8  --   --  3.7  --   INR 1.0  --   --   --   --   TSH  --   --  2.307  --   --   HGBA1C  --   --   --   --  4.9  AMMONIA  --  19  --   --   --   MG  --   --  2.1  --   --   CALCIUM 9.2  --   --  8.5*  --       Recent Labs  Lab 04/27/23 1054 04/27/23 1209 04/27/23 1950 04/28/23 0431 04/28/23 0656  INR 1.0  --   --   --   --   TSH  --   --  2.307  --   --   HGBA1C  --   --   --   --  4.9  AMMONIA  --  19  --   --   --   MG  --   --  2.1  --   --   CALCIUM 9.2  --   --  8.5*  --     Recent Labs  Lab 04/27/23 1054 04/28/23 0431  WBC 5.0 3.2*  PLT 81* 66*  CREATININE 0.67 0.72    ------------------------------------------------------------------------------------------------------------------ Lab Results  Component Value Date   CHOL 213 (H) 04/28/2023   HDL 87 04/28/2023   LDLCALC 119 (H) 04/28/2023   TRIG 37 04/28/2023   CHOLHDL 2.4 04/28/2023    Lab Results  Component Value Date   HGBA1C 4.9 04/28/2023    Recent Labs    04/27/23 1950  TSH 2.307   ------------------------------------------------------------------------------------------------------------------ Cardiac Enzymes No results for input(s): "CKMB", "TROPONINI", "MYOGLOBIN" in the last 168 hours.  Invalid input(s): "CK"  Micro Results No results found for this or any previous visit (from the  past 240 hour(s)).  Radiology Reports Overnight EEG with video  Result Date: 04/28/2023 Charlsie Quest, MD     04/28/2023  7:40 AM Patient Name: Bryse Blanchette MRN: [id number] Epilepsy Attending: Charlsie Quest Referring Physician/Provider: Erick Blinks, MD Duration: 04/26/2022 2215 to 04/28/2023 0730 Patient history: 47 y.o. male with PMH  significant for OSA, HTN, GERD, nephrolithiasis, hemorrhoids, EtOH use who had a 2 hour episode of aphasia, and ?right facial droop. Does endorse being stressed and not sure if this is entirely 2/2 stress. EEG to evaluate for seizure Level of alertness: Awake, asleep AEDs during EEG study: Ativan Technical aspects: This EEG study was done with scalp electrodes positioned according to the 10-20 International system of electrode placement. Electrical activity was reviewed with band pass filter of 1-70Hz , sensitivity of 7 uV/mm, display speed of 58mm/sec with a 60Hz  notched filter applied as appropriate. EEG data were recorded continuously and digitally stored.  Video monitoring was available and reviewed as appropriate. Description: The posterior dominant rhythm consists of 8-9 Hz activity of moderate voltage (25-35 uV) seen predominantly in posterior head regions, symmetric and reactive to eye opening and eye closing. Sleep was characterized by vertex waves, sleep spindles (12 to 14 Hz), maximal frontocentral region.  Physiologic photic driving was not seen during photic stimulation.  Hyperventilation was not performed.   IMPRESSION: This study is within normal limits. No seizures or epileptiform discharges were seen throughout the recording. A normal interictal EEG does not exclude  the diagnosis of epilepsy. Charlsie Quest   CT HEAD WO CONTRAST  Result Date: 04/27/2023 CLINICAL DATA:  TIA.  Head injury. EXAM: CT HEAD WITHOUT CONTRAST TECHNIQUE: Contiguous axial images were obtained from the base of the skull through the vertex without intravenous contrast. RADIATION DOSE REDUCTION: This exam was performed according to the departmental dose-optimization program which includes automated exposure control, adjustment of the mA and/or kV according to patient size and/or use of iterative reconstruction technique. COMPARISON:  Head CT 05/07/2022 FINDINGS: Brain: No evidence of acute infarction, hemorrhage,  hydrocephalus, extra-axial collection or mass lesion/mass effect. Vascular: No hyperdense vessel or unexpected calcification. Skull: Normal. Negative for fracture or focal lesion. Sinuses/Orbits: No acute finding. IMPRESSION: No acute or interval finding Electronically Signed   By: Tiburcio Pea M.D.   On: 04/27/2023 10:48      Signature  -   Susa Raring M.D on 04/28/2023 at 9:50 AM   -  To page go to [email or web address]

## 2023-04-28 NOTE — Progress Notes (Signed)
*  PRELIMINARY RESULTS* Echocardiogram 2D Echocardiogram has been performed.  Earlie Server Yassmin Binegar 04/28/2023, 1:05 PM

## 2023-04-28 NOTE — Evaluation (Signed)
Speech Language Pathology Evaluation Patient Details Name: Sean Torres MRN: [id number] DOB: [birth year] Today's Date: 04/28/2023 Time: 4098-1191 SLP Time Calculation (min) (ACUTE ONLY): 12 min  Problem List:  Patient Active Problem List   Diagnosis Date Noted   Stroke-like symptoms 04/27/2023   BRBPR (bright red blood per rectum) 04/27/2023   Chest pain 04/27/2023   Hypophosphatemia 10/29/2021   Hypokalemia 10/29/2021   Elevated liver enzymes 10/29/2021   Regular sinus tachycardia 10/29/2021   Pancreatitis 10/28/2021   Hepatic steatosis 10/28/2021   Thrombocytopenia (HCC) 10/28/2021   Acute pancreatitis 10/27/2021   Depression 10/27/2021   Alcohol use disorder, severe, dependence (HCC) 08/09/2019   Class 1 obesity in adult 08/09/2019   DOE (dyspnea on exertion) 08/09/2019   Essential hypertension 08/09/2019   OSA (obstructive sleep apnea) 08/09/2019   Transaminitis 08/09/2019   Sudden-onset sensorineural hearing loss 07/23/2019   Tinnitus aurium, left 07/23/2019   Mild concussion 06/14/2017   Avitaminosis D 08/08/2015   Acid reflux 08/02/2015   Past Medical History:  Past Medical History:  Diagnosis Date   Allergy    seasonal   Hypertension    Sleep apnea    Past Surgical History:  Past Surgical History:  Procedure Laterality Date   COLONOSCOPY  06/2018   FOOT SURGERY Bilateral    "had a pin put in my toes in 09/2019 and 2018, but I'm not sure what surgery it was specifically"    LUMBAR DISC SURGERY     RADIOLOGY WITH ANESTHESIA N/A 03/14/2021   Procedure: MRI WITH ANESTHESIA  BRAIN TO INCLUDE IAC WITH AND WITHOUT CONTRAST;  Surgeon: Radiologist, Medication, MD;  Location: [facility] OR;  Service: Radiology;  Laterality: N/A;   HPI:  Pt is a 47 y.o. male who presented to the ED due to a 2-hour history of an expressive aphasia, difficulty ambulating, slight right facial droop, and some intermittent bright red blood per rectum. Pt was admitted for TIA/CVA workup 6/22. Pt with  PMH significant of GERD, depression/anxiety, nephrolithiasis, hemorrhoids, left ear hearing loss, 2 head injuries in the past year, and daily heavy alcohol use.  Head CT negative for acute abnormality, MRI pending.   Assessment / Plan / Recommendation Clinical Impression  Pt presents with functional cognitive-linguistic abilities.  He completed the [facility] Mental Status Examination and scored overall 28/30 which is within normal limits.  Expressive and receptive language were functional and speech was fully intelligible to an unfamiliar listener.  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  No further skilled ST is warranted at this time.  Please re-consult if additional needs arise.    SLP Assessment  SLP Recommendation/Assessment: Patient does not need any further Speech Lanaguage Pathology Services SLP Visit Diagnosis: Cognitive communication deficit (R41.841)    Recommendations for follow up therapy are one component of a multi-disciplinary discharge planning process, led by the attending physician.  Recommendations may be updated based on patient status, additional functional criteria and insurance authorization.    Follow Up Recommendations  No SLP follow up    Assistance Recommended at Discharge  None  Functional Status Assessment Patient has had a recent decline in their functional status and demonstrates the ability to make significant improvements in function in a reasonable and predictable amount of time.  Frequency and Duration           SLP Evaluation Cognition  Overall Cognitive Status: Within Functional Limits for tasks assessed  Comprehension  Auditory Comprehension Overall Auditory Comprehension: Appears within functional limits for tasks assessed    Expression Expression Primary Mode of Expression: Verbal Verbal Expression Overall Verbal Expression: Appears within functional limits for tasks  assessed Written Expression Dominant Hand: Right   Oral / Motor  Oral Motor/Sensory Function Overall Oral Motor/Sensory Function: Within functional limits Motor Speech Overall Motor Speech: Appears within functional limits for tasks assessed           Eino Farber, M.S., CCC-SLP Acute Rehabilitation Services Office: [phone]  Shanon Rosser [facility] 04/28/2023, 2:47 PM

## 2023-04-29 ENCOUNTER — Other Ambulatory Visit (HOSPITAL_COMMUNITY): Payer: Self-pay

## 2023-04-29 ENCOUNTER — Telehealth (HOSPITAL_COMMUNITY): Payer: Self-pay | Admitting: Pharmacy Technician

## 2023-04-29 DIAGNOSIS — R2990 Unspecified symptoms and signs involving the nervous system: Secondary | ICD-10-CM | POA: Diagnosis not present

## 2023-04-29 LAB — COMPREHENSIVE METABOLIC PANEL
ALT: 90 U/L — ABNORMAL HIGH (ref 0–44)
AST: 99 U/L — ABNORMAL HIGH (ref 15–41)
Albumin: 4.1 g/dL (ref 3.5–5.0)
Alkaline Phosphatase: 63 U/L (ref 38–126)
Anion gap: 13 (ref 5–15)
BUN: <5 mg/dL — ABNORMAL LOW (ref 6–20)
CO2: 23 mmol/L (ref 22–32)
Calcium: 9.3 mg/dL (ref 8.9–10.3)
Chloride: 101 mmol/L (ref 98–111)
Creatinine, Ser: 0.65 mg/dL (ref 0.61–1.24)
GFR, Estimated: >60 mL/min (ref >60)
Glucose, Bld: 91 mg/dL (ref 70–99)
Potassium: 3.7 mmol/L (ref 3.5–5.1)
Sodium: 137 mmol/L (ref 135–145)
Total Bilirubin: 1.3 mg/dL — ABNORMAL HIGH (ref 0.3–1.2)
Total Protein: 6.8 g/dL (ref 6.5–8.1)

## 2023-04-29 LAB — MAGNESIUM: Magnesium: 2 mg/dL (ref 1.7–2.4)

## 2023-04-29 LAB — CBC WITH DIFFERENTIAL/PLATELET
Abs Immature Granulocytes: 0.01 10*3/uL (ref 0.00–0.07)
Basophils Absolute: 0 10*3/uL (ref 0.0–0.1)
Basophils Relative: 1 %
Eosinophils Absolute: 0.2 10*3/uL (ref 0.0–0.5)
Eosinophils Relative: 4 %
HCT: 39.1 % (ref 39.0–52.0)
Hemoglobin: 13.4 g/dL (ref 13.0–17.0)
Immature Granulocytes: 0 %
Lymphocytes Relative: 29 %
Lymphs Abs: 1.1 10*3/uL (ref 0.7–4.0)
MCH: 35.8 pg — ABNORMAL HIGH (ref 26.0–34.0)
MCHC: 34.3 g/dL (ref 30.0–36.0)
MCV: 104.5 fL — ABNORMAL HIGH (ref 80.0–100.0)
Monocytes Absolute: 0.7 10*3/uL (ref 0.1–1.0)
Monocytes Relative: 17 %
Neutro Abs: 1.9 10*3/uL (ref 1.7–7.7)
Neutrophils Relative %: 49 %
Platelets: 64 10*3/uL — ABNORMAL LOW (ref 150–400)
RBC: 3.74 MIL/uL — ABNORMAL LOW (ref 4.22–5.81)
RDW: 11.9 % (ref 11.5–15.5)
WBC: 3.8 10*3/uL — ABNORMAL LOW (ref 4.0–10.5)
nRBC: 0 % (ref 0.0–0.2)

## 2023-04-29 LAB — GLUCOSE, CAPILLARY: Glucose-Capillary: 105 mg/dL — ABNORMAL HIGH (ref 70–99)

## 2023-04-29 LAB — BRAIN NATRIURETIC PEPTIDE: B Natriuretic Peptide: 18.9 pg/mL (ref 0.0–100.0)

## 2023-04-29 MED ORDER — METOPROLOL TARTRATE 50 MG PO TABS
50.0000 mg | ORAL_TABLET | Freq: Two times a day (BID) | ORAL | Status: DC
  Administered 2023-04-29: 50 mg via ORAL
  Filled 2023-04-29: qty 1

## 2023-04-29 MED ORDER — CHLORDIAZEPOXIDE HCL 5 MG PO CAPS
10.0000 mg | ORAL_CAPSULE | Freq: Three times a day (TID) | ORAL | Status: DC
  Administered 2023-04-29: 10 mg via ORAL
  Filled 2023-04-29: qty 2

## 2023-04-29 MED ORDER — CHLORDIAZEPOXIDE HCL 5 MG PO CAPS: ORAL_CAPSULE | ORAL | 0 refills | Status: AC

## 2023-04-29 MED ORDER — METOPROLOL TARTRATE 50 MG PO TABS
50.0000 mg | ORAL_TABLET | Freq: Two times a day (BID) | ORAL | 0 refills | Status: AC
  Filled 2023-04-29: qty 60, 30d supply, fill #0

## 2023-04-29 MED ORDER — PANTOPRAZOLE SODIUM 40 MG PO TBEC
40.0000 mg | DELAYED_RELEASE_TABLET | Freq: Two times a day (BID) | ORAL | 0 refills | Status: AC
  Filled 2023-04-29: qty 30, 15d supply, fill #0

## 2023-04-29 MED ORDER — THIAMINE HCL 100 MG PO TABS
100.0000 mg | ORAL_TABLET | Freq: Every day | ORAL | 0 refills | Status: AC
  Filled 2023-04-29: qty 30, 30d supply, fill #0

## 2023-04-29 MED ORDER — FOLIC ACID 1 MG PO TABS
1.0000 mg | ORAL_TABLET | Freq: Every day | ORAL | 0 refills | Status: AC
  Filled 2023-04-29: qty 30, 30d supply, fill #0

## 2023-04-29 NOTE — Discharge Instructions (Signed)
Follow with Primary MD Lewis Moccasin, MD in 7 days   Get CBC, CMP, INR, Magnesium -  checked next visit with your primary MD   Activity: As tolerated with Full fall precautions use walker/cane & assistance as needed  Disposition Home    Diet: Heart Healthy    Special Instructions: If you have smoked or chewed Tobacco  in the last 2 yrs please stop smoking, stop any regular Alcohol  and or any Recreational drug use.  On your next visit with your primary care physician please Get Medicines reviewed and adjusted.  Please request your Prim.MD to go over all Hospital Tests and Procedure/Radiological results at the follow up, please get all Hospital records sent to your Prim MD by signing hospital release before you go home.  If you experience worsening of your admission symptoms, develop shortness of breath, life threatening emergency, suicidal or homicidal thoughts you must seek medical attention immediately by calling 911 or calling your MD immediately  if symptoms less severe.  You Must read complete instructions/literature along with all the possible adverse reactions/side effects for all the Medicines you take and that have been prescribed to you. Take any new Medicines after you have completely understood and accpet all the possible adverse reactions/side effects.

## 2023-04-29 NOTE — Telephone Encounter (Signed)
Pharmacy Patient Advocate Encounter   Received notification from  that prior authorization for Pantoprazole Sodium 40MG  dr tablets is required/requested.    [place] submitted to [employer] via CoverMyMeds Key/confirmation #/[id number] Status is pending

## 2023-04-29 NOTE — Discharge Summary (Signed)
Sean Torres HQI:[id number] DOB: 1976/04/21 DOA: 04/27/2023  PCP: Lewis Moccasin, MD  Admit date: 04/27/2023  Discharge date: 04/29/2023  Admitted From: Home   Disposition:  Home   Recommendations for Outpatient Follow-up:   Follow up with PCP in 1-2 weeks  PCP Please obtain BMP/CBC, 2 view CXR in 1week,  (see Discharge instructions)   PCP Please follow up on the following pending results: Monitor CBC, CMP closely, monitor alcohol use closely.  He needs outpatient GI and cardiology follow-up.   Home Health: None   Equipment/Devices: None  Consultations: None  Discharge Condition: Stable    CODE STATUS: Full    Diet Recommendation: Heart Healthy     Chief Complaint  Patient presents with   Head Injury   Facial Droop    (Resolved)     Brief history of present illness from the day of admission and additional interim summary     47 y.o. male with medical history significant of with past medical history of GERD, depression/anxiety, nephrolithiasis, hemorrhoids, left ear hearing loss, alcohol use presented to the [facility] with a 2-hour history of an expressive aphasia, difficulty ambulating, slight right facial droop, also some intermittent bright red blood per rectum.  Has had history of multiple head injuries, he is a heavy consumer of alcohol on a daily basis, was brought to the ER and was admitted for TIA/CVA workup, also now appears to be going into DTs.                                                                  Hospital Course   Episode of expressive aphasia, poor balance, slight right-sided facial droop, tremors. His symptoms were not very conclusive of a stroke, seen by neurology, undergoing TIA workup, no neurodeficits this morning, he also has history of heavy alcohol abuse and multiple vague  head injuries over the last 1 year.   He could certainly be having alcohol withdrawal/alcohol overdose symptoms, EEG over the course of last 8 hours nonacute, CT head nonacute, stable echocardiogram, could not undergo MRI, repeat head CTA and neck CTA unremarkable with no evidence of any major vessel involvement or CVA, was in early DTs much controlled by Librium, now eager to go home and at his baseline according to the patient.  Will be placed on Librium taper.   He was strictly counseled to abstain from alcohol use, does not want any outpatient resources, he has been requested to follow-up with his PCP within a week for repeat CBC, CMP check, request PCP to arrange for outpatient GI as well as gastroenterology follow-up.  If any neurological episodes recur outpatient neurology follow-up as well.       Daily alcohol abuse with DTs.  Counseled to quit, has mild asymptomatic transaminitis due to it.  Well-controlled on Librium, will be placed on Librium taper along with oral thiamine and folic acid, kindly see above for other details.   Alcohol abuse related, mild thrombocytopenia, asymptomatic transaminitis.  Due to #1 above.  He was question of some blood per rectum which sounded hemorrhoidal bleed, no further reoccurrence, stable H&H, INR stable, still has transaminitis with improved trend, still has thrombocytopenia, fatty liver on CT.  Request PCP to monitor CBC and CMP closely and arrange for outpatient GI follow-up.   Dyslipidemia.  Low-dose Crestor in the next few days once transaminitis stabilizes.  Defer this to PCP.   GERD.  PPI.   Depression/anxiety. Resume home regimen Wellbutrin and Zoloft.   Hypertension and sinus tachycardia.  Due to DTs, TSH stable, placed on beta-blocker PCP to monitor and adjust.   Intermittent outpatient bright red blood per rectum.  Sounds hemorrhoidal, CT ordered upon admission negative, oral PPI outpatient GI follow-up.  Mild drop in H&H seem delusional due  to IV fluids.   Atypical chest pain at the time of admission.  Resolved.  Follow echo, placed on beta-blocker, outpatient statin consideration per PCP once LFTs have stabilized, currently not a ideal candidate for aspirin due to underlying thrombocytopenia and some episodes of hemorrhoidal bleed, if thrombocytopenia improves and no further episodes of bleeding low-dose aspirin can be considered by PCP.  Request PCP to kindly arrange for outpatient cardiology follow-up.     Request PCP to arrange for outpatient GI and cardiology follow-up postdischarge.  If any neurological symptoms recur then neurology follow-up as well.   Discharge diagnosis     Principal Problem:   Stroke-like symptoms Active Problems:   Acid reflux   Alcohol use disorder, severe, dependence (HCC)   Class 1 obesity in adult   Essential hypertension   OSA (obstructive sleep apnea)   Transaminitis   Depression   Hepatic steatosis   Thrombocytopenia (HCC)   BRBPR (bright red blood per rectum)   Chest pain    Discharge instructions    Discharge Instructions     Diet - low sodium heart healthy   Complete by: As directed    Discharge instructions   Complete by: As directed    Follow with Primary MD Lewis Moccasin, MD in 7 days   Get CBC, CMP, INR, Magnesium -  checked next visit with your primary MD   Activity: As tolerated with Full fall precautions use walker/cane & assistance as needed  Disposition Home    Diet: Heart Healthy    Special Instructions: If you have smoked or chewed Tobacco  in the last 2 yrs please stop smoking, stop any regular Alcohol  and or any Recreational drug use.  On your next visit with your primary care physician please Get Medicines reviewed and adjusted.  Please request your Prim.MD to go over all Hospital Tests and Procedure/Radiological results at the follow up, please get all Hospital records sent to your Prim MD by signing hospital release before you go home.  If  you experience worsening of your admission symptoms, develop shortness of breath, life threatening emergency, suicidal or homicidal thoughts you must seek medical attention immediately by calling 911 or calling your MD immediately  if symptoms less severe.  You Must read complete instructions/literature along with all the possible adverse reactions/side effects for all the Medicines you take and that have been prescribed to you. Take any new Medicines after you have completely understood and accpet all the possible adverse reactions/side effects.   Increase  activity slowly   Complete by: As directed        Discharge Medications   Allergies as of 04/29/2023       Reactions   Erythromycin Base Itching, Rash        Medication List     STOP taking these medications    ALPRAZolam 0.25 MG tablet Commonly known as: XANAX   amoxicillin-clavulanate 875-125 MG tablet Commonly known as: AUGMENTIN   potassium chloride SA 20 MEQ tablet Commonly known as: KLOR-CON M   zolpidem 6.25 MG CR tablet Commonly known as: AMBIEN CR       TAKE these medications    buPROPion 150 MG 24 hr tablet Commonly known as: WELLBUTRIN XL Take 150 mg by mouth every morning.   chlordiazePOXIDE 5 MG capsule Commonly known as: LIBRIUM Please dispense 18 pills - Take 1 pill three times a day for 3 days, then Take 1 pill two times a day for 3 days, then Take 1 pill once a day for 3 days and stop.   desonide 0.05 % lotion Commonly known as: DESOWEN Apply 1 application topically daily as needed (facial skin patch).   folic acid 1 MG tablet Commonly known as: FOLVITE Take 1 tablet (1 mg total) by mouth daily.   metoprolol tartrate 50 MG tablet Commonly known as: LOPRESSOR Take 1 tablet (50 mg total) by mouth 2 (two) times daily.   pantoprazole 40 MG tablet Commonly known as: PROTONIX Take 1 tablet (40 mg total) by mouth 2 (two) times daily.   sertraline 50 MG tablet Commonly known as:  ZOLOFT Take 50 mg by mouth in the morning.   thiamine 100 MG tablet Commonly known as: Vitamin B-1 Take 1 tablet (100 mg total) by mouth daily.         Follow-up Information     Lewis Moccasin, MD. Schedule an appointment as soon as possible for a visit in 1 week(s).   Specialty: Family Medicine Contact information: [address] [place] [address] [phone]         Kathi Der, MD. Schedule an appointment as soon as possible for a visit in 1 week(s).   Specialty: Gastroenterology Why: Early alcoholic hepatitis Contact information: [address] [place] [address] [phone]                 Major procedures and Radiology Reports - PLEASE review detailed and final reports thoroughly  -       ECHOCARDIOGRAM COMPLETE  Result Date: 04/28/2023    ECHOCARDIOGRAM REPORT   Patient Name:   Sean Torres Date of Exam: 04/28/2023 Medical Rec #:  [id number]  Height:       71.0 in Accession #:    [id number] Weight:       184.9 lb Date of Birth:  May 08, 1976  BSA:          2.039 m Patient Age:    46 years   BP:           172/115 mmHg Patient Gender: M          HR:           120 bpm. Exam Location:  Inpatient Procedure: 2D Echo, Cardiac Doppler and Color Doppler Indications:    CHF-Acute Diastolic I50.31  History:        Patient has no prior history of Echocardiogram examinations.                 Signs/Symptoms:Dyspnea; Risk Factors:Hypertension,  Sleep Apnea                 and Former Smoker.  Sonographer:    Dondra Prader RVT RCS Referring Phys: [id number] [place] [facility]  Sonographer Comments: Elevated HR. IMPRESSIONS  1. Left ventricular ejection fraction, by estimation, is 60 to 65%. The left ventricle has normal function. The left ventricle has no regional wall motion abnormalities. There is moderate concentric left ventricular hypertrophy. Indeterminate diastolic filling due to E-A fusion.  2. Right ventricular systolic function is normal. The right  ventricular size is normal. Tricuspid regurgitation signal is inadequate for assessing PA pressure.  3. The mitral valve is normal in structure. No evidence of mitral valve regurgitation.  4. The aortic valve is tricuspid. Aortic valve regurgitation is not visualized. No aortic stenosis is present.  5. The inferior vena cava is normal in size with greater than 50% respiratory variability, suggesting right atrial pressure of 3 mmHg. Comparison(s): Diastolic function cannot be well assessed due to tachycardia and fusion of early and late diastolic flowvelocity signals. Consider repeating a limited study when the heart rate is normal. FINDINGS  Left Ventricle: Left ventricular ejection fraction, by estimation, is 60 to 65%. The left ventricle has normal function. The left ventricle has no regional wall motion abnormalities. The left ventricular internal cavity size was normal in size. There is  moderate concentric left ventricular hypertrophy. Indeterminate diastolic filling due to E-A fusion. Right Ventricle: The right ventricular size is normal. No increase in right ventricular wall thickness. Right ventricular systolic function is normal. Tricuspid regurgitation signal is inadequate for assessing PA pressure. Left Atrium: Left atrial size was normal in size. Right Atrium: Right atrial size was normal in size. Pericardium: There is no evidence of pericardial effusion. Mitral Valve: The mitral valve is normal in structure. No evidence of mitral valve regurgitation. Tricuspid Valve: The tricuspid valve is normal in structure. Tricuspid valve regurgitation is not demonstrated. Aortic Valve: The aortic valve is tricuspid. Aortic valve regurgitation is not visualized. No aortic stenosis is present. Aortic valve mean gradient measures 2.0 mmHg. Aortic valve peak gradient measures 4.5 mmHg. Aortic valve area, by VTI measures 2.77 cm. Pulmonic Valve: The pulmonic valve was grossly normal. Pulmonic valve regurgitation is not  visualized. No evidence of pulmonic stenosis. Aorta: The aortic root is normal in size and structure. Venous: The inferior vena cava is normal in size with greater than 50% respiratory variability, suggesting right atrial pressure of 3 mmHg. IAS/Shunts: The interatrial septum was not well visualized.  LEFT VENTRICLE PLAX 2D LVIDd:         3.70 cm   Diastology LVIDs:         2.60 cm   LV e' medial:    9.14 cm/s LV PW:         1.50 cm   LV E/e' medial:  8.2 LV IVS:        1.50 cm   LV e' lateral:   11.60 cm/s LVOT diam:     2.20 cm   LV E/e' lateral: 6.4 LV SV:         38 LV SV Index:   19 LVOT Area:     3.80 cm  RIGHT VENTRICLE             IVC RV S prime:     14.50 cm/s  IVC diam: 1.60 cm TAPSE (M-mode): 1.7 cm LEFT ATRIUM           Index LA diam:  3.60 cm 1.77 cm/m LA Vol (A2C): 21.3 ml 10.44 ml/m LA Vol (A4C): 16.2 ml 7.94 ml/m  AORTIC VALVE                    PULMONIC VALVE AV Area (Vmax):    3.20 cm     PV Vmax:       0.90 m/s AV Area (Vmean):   2.93 cm     PV Peak grad:  3.3 mmHg AV Area (VTI):     2.77 cm AV Vmax:           106.00 cm/s AV Vmean:          70.100 cm/s AV VTI:            0.137 m AV Peak Grad:      4.5 mmHg AV Mean Grad:      2.0 mmHg LVOT Vmax:         89.10 cm/s LVOT Vmean:        54.000 cm/s LVOT VTI:          0.100 m LVOT/AV VTI ratio: 0.73  AORTA Ao Root diam: 3.40 cm MITRAL VALVE MV Area (PHT): 4.89 cm    SHUNTS MV Decel Time: 155 msec    Systemic VTI:  0.10 m MV E velocity: 74.50 cm/s  Systemic Diam: 2.20 cm Rachelle Hora Croitoru MD Electronically signed by Thurmon Fair MD Signature Date/Time: 04/28/2023/2:26:27 PM    Final    CT ANGIO GI BLEED  Result Date: 04/28/2023 CLINICAL DATA:  Bloody stools. EXAM: CTA ABDOMEN AND PELVIS WITHOUT AND WITH CONTRAST TECHNIQUE: Multidetector CT imaging of the abdomen and pelvis was performed using the standard protocol during bolus administration of intravenous contrast. Multiplanar reconstructed images and MIPs were obtained and reviewed to  evaluate the vascular anatomy. RADIATION DOSE REDUCTION: This exam was performed according to the departmental dose-optimization program which includes automated exposure control, adjustment of the mA and/or kV according to patient size and/or use of iterative reconstruction technique. CONTRAST:  OMNIPAQUE IOHEXOL 350 MG/ML SOLN COMPARISON:  October 27, 2021. FINDINGS: VASCULAR Aorta: Normal caliber aorta without aneurysm, dissection, vasculitis or significant stenosis. Celiac: Patent without evidence of aneurysm, dissection, vasculitis or significant stenosis. SMA: Patent without evidence of aneurysm, dissection, vasculitis or significant stenosis. Renals: Bilateral renal arteries are patent without evidence of aneurysm, dissection, vasculitis, fibromuscular dysplasia or significant stenosis. IMA: Patent without evidence of aneurysm, dissection, vasculitis or significant stenosis. Inflow: Patent without evidence of aneurysm, dissection, vasculitis or significant stenosis. Proximal Outflow: Bilateral common femoral and visualized portions of the superficial and profunda femoral arteries are patent without evidence of aneurysm, dissection, vasculitis or significant stenosis. Veins: No obvious venous abnormality within the limitations of this arterial phase study. Review of the MIP images confirms the above findings. NON-VASCULAR Lower chest: No acute abnormality. Hepatobiliary: Hepatic steatosis. No cholelithiasis or biliary dilatation. Pancreas: Unremarkable. No pancreatic ductal dilatation or surrounding inflammatory changes. Spleen: Normal in size without focal abnormality. Adrenals/Urinary Tract: Adrenal glands appear normal. Nonobstructive left renal calculi are noted. No hydronephrosis or renal obstruction is noted. Urinary bladder is unremarkable. Stomach/Bowel: Stomach is within normal limits. Appendix appears normal. No evidence of bowel wall thickening, distention, or inflammatory changes. No  evidence of contrast extravasation is seen to suggest active gastrointestinal bleeding. Lymphatic: No adenopathy is noted. Reproductive: Prostate is unremarkable. Other: No abdominal wall hernia or abnormality. No abdominopelvic ascites. Musculoskeletal: No acute or significant osseous findings. IMPRESSION: VASCULAR No definite evidence of gastrointestinal bleeding  is noted. NON-VASCULAR Hepatic steatosis. Nonobstructive left nephrolithiasis. Electronically Signed   By: Lupita Raider M.D.   On: 04/28/2023 12:50   CT ANGIO HEAD NECK W WO CM  Result Date: 04/28/2023 CLINICAL DATA:  Neuro deficit, acute, stroke suspected EXAM: CT ANGIOGRAPHY HEAD AND NECK WITH AND WITHOUT CONTRAST TECHNIQUE: Multidetector CT imaging of the head and neck was performed using the standard protocol during bolus administration of intravenous contrast. Multiplanar CT image reconstructions and MIPs were obtained to evaluate the vascular anatomy. Carotid stenosis measurements (when applicable) are obtained utilizing NASCET criteria, using the distal internal carotid diameter as the denominator. RADIATION DOSE REDUCTION: This exam was performed according to the departmental dose-optimization program which includes automated exposure control, adjustment of the mA and/or kV according to patient size and/or use of iterative reconstruction technique. CONTRAST:  OMNIPAQUE IOHEXOL 350 MG/ML SOLN COMPARISON:  CT Head 05/07/22 FINDINGS: CT HEAD FINDINGS Brain: No evidence of acute infarction, hemorrhage, hydrocephalus, extra-axial collection or mass lesion/mass effect. Vascular: No hyperdense vessel or unexpected calcification. Skull: Normal. Negative for fracture or focal lesion. Sinuses/Orbits: No middle ear or mastoid effusion. Paranasal sinuses are clear. Orbits are unremarkable. Other: None. Review of the MIP images confirms the above findings CTA NECK FINDINGS Aortic arch: Standard branching. Imaged portion shows no evidence of aneurysm  or dissection. No significant stenosis of the major arch vessel origins. Right carotid system: No evidence of dissection, stenosis (50% or greater), or occlusion. Left carotid system: No evidence of dissection, stenosis (50% or greater), or occlusion. Vertebral arteries: Codominant. No evidence of dissection, stenosis (50% or greater), or occlusion. Skeleton: Negative. Other neck: Negative. Upper chest: Negative. Review of the MIP images confirms the above findings CTA HEAD FINDINGS Anterior circulation: No significant stenosis, proximal occlusion, aneurysm, or vascular malformation. Posterior circulation: No significant stenosis, proximal occlusion, aneurysm, or vascular malformation. Venous sinuses: As permitted by contrast timing, patent. Anatomic variants: None Review of the MIP images confirms the above findings IMPRESSION: 1. No acute intracranial abnormality. 2. No intracranial large vessel occlusion or significant stenosis. 3. No hemodynamically significant stenosis in the neck. Electronically Signed   By: Lorenza Cambridge M.D.   On: 04/28/2023 12:49   Overnight EEG with video  Result Date: 04/28/2023 Charlsie Quest, MD     04/28/2023  7:40 AM Patient Name: Sean Torres MRN: [id number] Epilepsy Attending: Charlsie Quest Referring Physician/Provider: Erick Blinks, MD Duration: 04/26/2022 2215 to 04/28/2023 0730 Patient history: 47 y.o. male with PMH significant for OSA, HTN, GERD, nephrolithiasis, hemorrhoids, EtOH use who had a 2 hour episode of aphasia, and ?right facial droop. Does endorse being stressed and not sure if this is entirely 2/2 stress. EEG to evaluate for seizure Level of alertness: Awake, asleep AEDs during EEG study: Ativan Technical aspects: This EEG study was done with scalp electrodes positioned according to the 10-20 International system of electrode placement. Electrical activity was reviewed with band pass filter of 1-70Hz , sensitivity of 7 uV/mm, display speed of 62mm/sec with a  60Hz  notched filter applied as appropriate. EEG data were recorded continuously and digitally stored.  Video monitoring was available and reviewed as appropriate. Description: The posterior dominant rhythm consists of 8-9 Hz activity of moderate voltage (25-35 uV) seen predominantly in posterior head regions, symmetric and reactive to eye opening and eye closing. Sleep was characterized by vertex waves, sleep spindles (12 to 14 Hz), maximal frontocentral region.  Physiologic photic driving was not seen during photic stimulation.  Hyperventilation was not performed.  IMPRESSION: This study is within normal limits. No seizures or epileptiform discharges were seen throughout the recording. A normal interictal EEG does not exclude  the diagnosis of epilepsy. Charlsie Quest   CT HEAD WO CONTRAST  Result Date: 04/27/2023 CLINICAL DATA:  TIA.  Head injury. EXAM: CT HEAD WITHOUT CONTRAST TECHNIQUE: Contiguous axial images were obtained from the base of the skull through the vertex without intravenous contrast. RADIATION DOSE REDUCTION: This exam was performed according to the departmental dose-optimization program which includes automated exposure control, adjustment of the mA and/or kV according to patient size and/or use of iterative reconstruction technique. COMPARISON:  Head CT 05/07/2022 FINDINGS: Brain: No evidence of acute infarction, hemorrhage, hydrocephalus, extra-axial collection or mass lesion/mass effect. Vascular: No hyperdense vessel or unexpected calcification. Skull: Normal. Negative for fracture or focal lesion. Sinuses/Orbits: No acute finding. IMPRESSION: No acute or interval finding Electronically Signed   By: Tiburcio Pea M.D.   On: 04/27/2023 10:48    Micro Results     No results found for this or any previous visit (from the past 240 hour(s)).  Today   Subjective    Sean Torres today has no headache,no chest abdominal pain,no new weakness tingling or numbness, feels much better  wants to go home today.     Objective   Blood pressure 150/90 pulse 80, temperature 98 F (36.7 C), temperature source Oral, resp. rate 18, height 5\' 11"  (1.803 m), weight 87.5 kg, SpO2 95 %.   Intake/Output Summary (Last 24 hours) at 04/29/2023 0806 Last data filed at 04/29/2023 0300 Gross per 24 hour  Intake 1925.37 ml  Output --  Net 1925.37 ml    Exam  Awake Alert, No new F.N deficits,    [place].AT,PERRAL Supple Neck,   Symmetrical Chest wall movement, Good air movement bilaterally, CTAB RRR,No Gallops,   +ve B.Sounds, Abd Soft, Non tender,  No Cyanosis, Clubbing or edema    Data Review   Recent Labs  Lab 04/27/23 1054 04/28/23 0431 04/29/23 0411  WBC 5.0 3.2* 3.8*  HGB 13.4 11.9* 13.4  HCT 39.1 34.3* 39.1  PLT 81* 66* 64*  MCV 102.9* 103.3* 104.5*  MCH 35.3* 35.8* 35.8*  MCHC 34.3 34.7 34.3  RDW 12.4 12.1 11.9  LYMPHSABS 1.6  --  1.1  MONOABS 0.6  --  0.7  EOSABS 0.2  --  0.2  BASOSABS 0.0  --  0.0    Recent Labs  Lab 04/27/23 1054 04/27/23 1209 04/27/23 1950 04/28/23 0431 04/28/23 0656 04/29/23 0411  NA 140  --   --  139  --  137  K 3.8  --   --  3.6  --  3.7  CL 98  --   --  102  --  101  CO2 25  --   --  23  --  23  ANIONGAP 17*  --   --  14  --  13  GLUCOSE 75  --   --  94  --  91  BUN 9  --   --  5*  --  <5*  CREATININE 0.67  --   --  0.72  --  0.65  AST 112*  --   --  96*  --  99*  ALT 102*  --   --  91*  --  90*  ALKPHOS 72  --   --  66  --  63  BILITOT 0.6  --   --  0.8  --  1.3*  ALBUMIN 4.8  --   --  3.7  --  4.1  INR 1.0  --   --   --   --   --   TSH  --   --  2.307  --   --   --   HGBA1C  --   --   --   --  4.9  --   AMMONIA  --  19  --   --   --   --   BNP  --   --   --   --   --  18.9  MG  --   --  2.1  --   --  2.0  CALCIUM 9.2  --   --  8.5*  --  9.3   Lab Results  Component Value Date   CHOL 213 (H) 04/28/2023   HDL 87 04/28/2023   LDLCALC 119 (H) 04/28/2023   TRIG 37 04/28/2023   CHOLHDL 2.4 04/28/2023    Total Time  in preparing paper work, data evaluation and todays exam - 35 minutes  Signature  -    Susa Raring M.D on 04/29/2023 at 8:06 AM   -  To page go to [email or web address]

## 2023-05-01 NOTE — Telephone Encounter (Signed)
Pharmacy Patient Advocate Encounter  Received notification from [facility] that Prior Authorization for Pantoprazole Sodium 40MG  dr tablets  has been APPROVED from 04/30/2023 to 04/28/2024.Marland Kitchen  PA #/Case ID/Reference #: [id number]

## 2023-07-23 NOTE — Progress Notes (Signed)
treated

## 2023-09-23 DIAGNOSIS — E559 Vitamin D deficiency, unspecified: Secondary | ICD-10-CM | POA: Diagnosis not present

## 2023-09-23 DIAGNOSIS — M545 Low back pain, unspecified: Secondary | ICD-10-CM | POA: Diagnosis not present

## 2023-09-23 DIAGNOSIS — Z136 Encounter for screening for cardiovascular disorders: Secondary | ICD-10-CM | POA: Diagnosis not present

## 2023-09-23 DIAGNOSIS — R5383 Other fatigue: Secondary | ICD-10-CM | POA: Diagnosis not present

## 2024-02-08 DIAGNOSIS — F419 Anxiety disorder, unspecified: Secondary | ICD-10-CM | POA: Diagnosis not present

## 2024-02-08 DIAGNOSIS — Z8673 Personal history of transient ischemic attack (TIA), and cerebral infarction without residual deficits: Secondary | ICD-10-CM | POA: Diagnosis not present

## 2024-02-08 DIAGNOSIS — E872 Acidosis, unspecified: Secondary | ICD-10-CM | POA: Diagnosis not present

## 2024-02-08 DIAGNOSIS — F0781 Postconcussional syndrome: Secondary | ICD-10-CM | POA: Diagnosis not present

## 2024-02-08 DIAGNOSIS — F445 Conversion disorder with seizures or convulsions: Secondary | ICD-10-CM | POA: Diagnosis not present

## 2024-02-08 DIAGNOSIS — K858 Other acute pancreatitis without necrosis or infection: Secondary | ICD-10-CM | POA: Diagnosis not present

## 2024-02-08 DIAGNOSIS — K219 Gastro-esophageal reflux disease without esophagitis: Secondary | ICD-10-CM | POA: Diagnosis not present

## 2024-02-08 DIAGNOSIS — R000 Tachycardia, unspecified: Secondary | ICD-10-CM | POA: Diagnosis not present

## 2024-02-08 DIAGNOSIS — K859 Acute pancreatitis without necrosis or infection, unspecified: Secondary | ICD-10-CM | POA: Diagnosis not present

## 2024-02-08 DIAGNOSIS — G40909 Epilepsy, unspecified, not intractable, without status epilepticus: Secondary | ICD-10-CM | POA: Diagnosis not present

## 2024-02-08 DIAGNOSIS — I10 Essential (primary) hypertension: Secondary | ICD-10-CM | POA: Diagnosis not present

## 2024-02-08 DIAGNOSIS — F688 Other specified disorders of adult personality and behavior: Secondary | ICD-10-CM | POA: Diagnosis not present

## 2024-02-08 DIAGNOSIS — Z5329 Procedure and treatment not carried out because of patient's decision for other reasons: Secondary | ICD-10-CM | POA: Diagnosis not present

## 2024-02-08 DIAGNOSIS — K760 Fatty (change of) liver, not elsewhere classified: Secondary | ICD-10-CM | POA: Diagnosis not present

## 2024-02-08 DIAGNOSIS — F1020 Alcohol dependence, uncomplicated: Secondary | ICD-10-CM | POA: Diagnosis not present

## 2024-05-13 DIAGNOSIS — R29898 Other symptoms and signs involving the musculoskeletal system: Secondary | ICD-10-CM | POA: Diagnosis not present

## 2024-05-13 DIAGNOSIS — D696 Thrombocytopenia, unspecified: Secondary | ICD-10-CM | POA: Diagnosis not present

## 2024-05-13 DIAGNOSIS — R7989 Other specified abnormal findings of blood chemistry: Secondary | ICD-10-CM | POA: Diagnosis not present

## 2024-05-13 DIAGNOSIS — G459 Transient cerebral ischemic attack, unspecified: Secondary | ICD-10-CM | POA: Diagnosis not present

## 2024-05-13 DIAGNOSIS — F10129 Alcohol abuse with intoxication, unspecified: Secondary | ICD-10-CM | POA: Diagnosis not present

## 2024-05-13 DIAGNOSIS — Z8673 Personal history of transient ischemic attack (TIA), and cerebral infarction without residual deficits: Secondary | ICD-10-CM | POA: Diagnosis not present

## 2024-05-13 DIAGNOSIS — D72819 Decreased white blood cell count, unspecified: Secondary | ICD-10-CM | POA: Diagnosis not present

## 2024-05-13 DIAGNOSIS — R4781 Slurred speech: Secondary | ICD-10-CM | POA: Diagnosis not present

## 2024-05-13 DIAGNOSIS — R419 Unspecified symptoms and signs involving cognitive functions and awareness: Secondary | ICD-10-CM | POA: Diagnosis not present

## 2024-05-13 DIAGNOSIS — R000 Tachycardia, unspecified: Secondary | ICD-10-CM | POA: Diagnosis not present

## 2024-05-13 DIAGNOSIS — R2681 Unsteadiness on feet: Secondary | ICD-10-CM | POA: Diagnosis not present

## 2024-05-13 DIAGNOSIS — E8729 Other acidosis: Secondary | ICD-10-CM | POA: Diagnosis not present

## 2024-05-13 DIAGNOSIS — R9431 Abnormal electrocardiogram [ECG] [EKG]: Secondary | ICD-10-CM | POA: Diagnosis not present

## 2024-05-13 DIAGNOSIS — R200 Anesthesia of skin: Secondary | ICD-10-CM | POA: Diagnosis not present

## 2024-05-13 DIAGNOSIS — R519 Headache, unspecified: Secondary | ICD-10-CM | POA: Diagnosis not present

## 2024-05-13 DIAGNOSIS — Z79899 Other long term (current) drug therapy: Secondary | ICD-10-CM | POA: Diagnosis not present

## 2024-05-13 DIAGNOSIS — R079 Chest pain, unspecified: Secondary | ICD-10-CM | POA: Diagnosis not present

## 2024-05-14 DIAGNOSIS — R079 Chest pain, unspecified: Secondary | ICD-10-CM | POA: Diagnosis not present

## 2024-05-14 DIAGNOSIS — R519 Headache, unspecified: Secondary | ICD-10-CM | POA: Diagnosis not present

## 2024-05-14 DIAGNOSIS — G459 Transient cerebral ischemic attack, unspecified: Secondary | ICD-10-CM | POA: Diagnosis not present

## 2024-05-14 DIAGNOSIS — R29898 Other symptoms and signs involving the musculoskeletal system: Secondary | ICD-10-CM | POA: Diagnosis not present

## 2024-05-15 DIAGNOSIS — G459 Transient cerebral ischemic attack, unspecified: Secondary | ICD-10-CM | POA: Diagnosis not present

## 2024-05-15 DIAGNOSIS — G44229 Chronic tension-type headache, not intractable: Secondary | ICD-10-CM | POA: Diagnosis not present

## 2024-06-03 DIAGNOSIS — Z09 Encounter for follow-up examination after completed treatment for conditions other than malignant neoplasm: Secondary | ICD-10-CM | POA: Diagnosis not present

## 2024-06-03 DIAGNOSIS — I10 Essential (primary) hypertension: Secondary | ICD-10-CM | POA: Diagnosis not present

## 2024-06-03 DIAGNOSIS — G459 Transient cerebral ischemic attack, unspecified: Secondary | ICD-10-CM | POA: Diagnosis not present

## 2024-08-13 DIAGNOSIS — E785 Hyperlipidemia, unspecified: Secondary | ICD-10-CM | POA: Diagnosis not present

## 2024-08-13 DIAGNOSIS — G459 Transient cerebral ischemic attack, unspecified: Secondary | ICD-10-CM | POA: Diagnosis not present

## 2024-08-13 DIAGNOSIS — I10 Essential (primary) hypertension: Secondary | ICD-10-CM | POA: Diagnosis not present

## 2024-08-13 DIAGNOSIS — Z5189 Encounter for other specified aftercare: Secondary | ICD-10-CM | POA: Diagnosis not present

## 2024-08-13 DIAGNOSIS — R5383 Other fatigue: Secondary | ICD-10-CM | POA: Diagnosis not present

## 2024-09-29 DIAGNOSIS — K921 Melena: Secondary | ICD-10-CM | POA: Diagnosis not present

## 2024-09-29 DIAGNOSIS — K219 Gastro-esophageal reflux disease without esophagitis: Secondary | ICD-10-CM | POA: Diagnosis not present
